# Patient Record
Sex: Female | Born: 1945 | Race: White | Hispanic: No | Smoking: Never smoker
Health system: Southern US, Community
[De-identification: ages and names within clinical notes are randomized; demographics above are authoritative.]

## PROBLEM LIST (undated history)

## (undated) DIAGNOSIS — E079 Disorder of thyroid, unspecified: Secondary | ICD-10-CM

## (undated) HISTORY — PX: BACK SURGERY: SHX140

## (undated) HISTORY — PX: APPENDECTOMY: SHX54

## (undated) HISTORY — PX: KNEE ARTHROSCOPY: SUR90

## (undated) HISTORY — PX: ABDOMINAL SURGERY: SHX537

## (undated) HISTORY — PX: ABDOMINAL HYSTERECTOMY: SHX81

---

## 2016-04-11 ENCOUNTER — Emergency Department (HOSPITAL_COMMUNITY)
Admission: EM | Admit: 2016-04-11 | Discharge: 2016-04-11 | Disposition: A | Discharge status: Home / Self Care | Payer: Medicare HMO | Attending: Emergency Medicine | Admitting: Emergency Medicine

## 2016-04-11 ENCOUNTER — Encounter (HOSPITAL_COMMUNITY): Payer: Self-pay | Admitting: Emergency Medicine

## 2016-04-11 DIAGNOSIS — F1193 Opioid use, unspecified with withdrawal: Secondary | ICD-10-CM

## 2016-04-11 DIAGNOSIS — R69 Illness, unspecified: Secondary | ICD-10-CM | POA: Diagnosis not present

## 2016-04-11 DIAGNOSIS — Z79899 Other long term (current) drug therapy: Secondary | ICD-10-CM | POA: Diagnosis not present

## 2016-04-11 DIAGNOSIS — F1123 Opioid dependence with withdrawal: Secondary | ICD-10-CM | POA: Insufficient documentation

## 2016-04-11 HISTORY — DX: Disorder of thyroid, unspecified: E07.9

## 2016-04-11 LAB — URINALYSIS, ROUTINE W REFLEX MICROSCOPIC
Bilirubin Urine: NEGATIVE
GLUCOSE, UA: NEGATIVE mg/dL
Ketones, ur: NEGATIVE mg/dL
Nitrite: NEGATIVE
PH: 7 (ref 5.0–8.0)
PROTEIN: NEGATIVE mg/dL
Specific Gravity, Urine: 1.015 (ref 1.005–1.030)

## 2016-04-11 LAB — CBC WITH DIFFERENTIAL/PLATELET
BASOS PCT: 0 %
Basophils Absolute: 0 10*3/uL (ref 0.0–0.1)
Eosinophils Absolute: 0.1 10*3/uL (ref 0.0–0.7)
Eosinophils Relative: 2 %
HEMATOCRIT: 40.4 % (ref 36.0–46.0)
HEMOGLOBIN: 13.9 g/dL (ref 12.0–15.0)
LYMPHS ABS: 2.4 10*3/uL (ref 0.7–4.0)
LYMPHS PCT: 30 %
MCH: 29.4 pg (ref 26.0–34.0)
MCHC: 34.4 g/dL (ref 30.0–36.0)
MCV: 85.4 fL (ref 78.0–100.0)
MONO ABS: 0.7 10*3/uL (ref 0.1–1.0)
MONOS PCT: 8 %
NEUTROS PCT: 60 %
Neutro Abs: 4.8 10*3/uL (ref 1.7–7.7)
Platelets: 351 10*3/uL (ref 150–400)
RBC: 4.73 MIL/uL (ref 3.87–5.11)
RDW: 13.6 % (ref 11.5–15.5)
WBC: 8 10*3/uL (ref 4.0–10.5)

## 2016-04-11 LAB — RAPID URINE DRUG SCREEN, HOSP PERFORMED
AMPHETAMINES: NOT DETECTED
BENZODIAZEPINES: NOT DETECTED
Barbiturates: NOT DETECTED
COCAINE: NOT DETECTED
Opiates: NOT DETECTED
Tetrahydrocannabinol: NOT DETECTED

## 2016-04-11 LAB — COMPREHENSIVE METABOLIC PANEL
ALBUMIN: 4.3 g/dL (ref 3.5–5.0)
ALK PHOS: 68 U/L (ref 38–126)
ALT: 12 U/L — ABNORMAL LOW (ref 14–54)
ANION GAP: 9 (ref 5–15)
AST: 18 U/L (ref 15–41)
BUN: 14 mg/dL (ref 6–20)
CHLORIDE: 99 mmol/L — AB (ref 101–111)
CO2: 29 mmol/L (ref 22–32)
Calcium: 9.8 mg/dL (ref 8.9–10.3)
Creatinine, Ser: 0.9 mg/dL (ref 0.44–1.00)
GFR calc non Af Amer: >60 mL/min (ref >60)
GLUCOSE: 110 mg/dL — AB (ref 65–99)
POTASSIUM: 3.9 mmol/L (ref 3.5–5.1)
SODIUM: 137 mmol/L (ref 135–145)
Total Bilirubin: 1.1 mg/dL (ref 0.3–1.2)
Total Protein: 7.7 g/dL (ref 6.5–8.1)

## 2016-04-11 LAB — ETHANOL: Alcohol, Ethyl (B): <5 mg/dL (ref <5)

## 2016-04-11 MED ORDER — CLONIDINE HCL 0.1 MG PO TABS
0.1000 mg | ORAL_TABLET | Freq: Three times a day (TID) | ORAL | Status: DC | PRN
  Administered 2016-04-11: 0.1 mg via ORAL
  Filled 2016-04-11: qty 1

## 2016-04-11 MED ORDER — CLONIDINE HCL 0.1 MG PO TABS: 0.1000 mg | ORAL_TABLET | Freq: Two times a day (BID) | ORAL | 1 refills | Status: AC | PRN

## 2016-04-11 NOTE — Discharge Instructions (Signed)
Please follow up with your pain clinic Take the Clonidine twice daily as needed for withdrawal symptoms DO NOT take the clonidine if your blood pressure is below 120 - I suggest that you get a home blood pressure cuff so that you can measure this before you take the medication.  Start [place] 3 times a day, call your family doctor and let them know the regarding his medication. Please make sure that you go to your pain clinic

## 2016-04-11 NOTE — ED Notes (Signed)
Patient belongings returned to patient at this time. 

## 2016-04-11 NOTE — ED Notes (Signed)
Pt reports that she has been off her fentanyl patch for 2 weeks. 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 Has been on patch for 18 years. States patch makes her nervous and cold sweats by the 3rd day. Now unable to sleep, anxious, cold sweats and has thoughts of harming self

## 2016-04-11 NOTE — ED Notes (Signed)
[facility] called and does not meet inpt criteria. Will fax over resources

## 2016-04-11 NOTE — BH Assessment (Signed)
Tele Assessment Note   Julie Kirk is an 71 y.o. female. Pt reports withdrawal symptoms from a Fentanyl patch. Per Pt she's had a Fentanyl patch for 18 years due to pain in her leg. Pt states she's been wanting the patch removed from her leg due to complications but her physician refused. Pt states she took the patch off herself and she immediately began having withdrawal. Pt reports the following withdrawal symptoms: cold chills, sweating, dirreahea, muscle cramps, anxiety, and muscle spasms. Pt reports SI with no plan due to withdrawal symptoms. Pt denies HI and AVH. Pt states she can contract for safety and contact SA programs for her withdrawal symptoms. Pt denies previous hospitalizations. Pt denies current mental health treatment.   Writer consulted with Jacki Cones, NP. Per Jacki Cones, NP Pt does not meet inpatient criteria. Recommends follow-up with outpatient SA resources.   Diagnosis:   F11.20   Past Medical History:  Past Medical History:  Diagnosis Date  . Thyroid disease     Past Surgical History:  Procedure Laterality Date  . ABDOMINAL HYSTERECTOMY    . ABDOMINAL SURGERY    . APPENDECTOMY    . BACK SURGERY    . KNEE ARTHROSCOPY      Family History: History reviewed. No pertinent family history.  Social History:  reports that she has never smoked. She does not have any smokeless tobacco history on file. She reports that she does not drink alcohol or use drugs.  Additional Social History:  Alcohol / Drug Use Pain Medications: Please see MAR Prescriptions: Please see MAR Over the Counter: Please see MAR History of alcohol / drug use?: No history of alcohol / drug abuse Longest period of sobriety (when/how long): NA  CIWA: CIWA-Ar BP: 116/82 Pulse Rate: 107 COWS: Clinical Opiate Withdrawal Scale (COWS) Resting Pulse Rate: Pulse Rate 80 or below Sweating: Subjective report of chills or flushing Restlessness: Reports difficulty sitting still, but is able to do so Pupil  Size: Pupils pinned or normal size for room light Bone or Joint Aches: Mild diffuse discomfort Runny Nose or Tearing: Nose running or tearing GI Upset: Stomach cramps Tremor: Slight tremor observable Yawning: No yawning Anxiety or Irritability: Patient obviously irritable/anxious Gooseflesh Skin: Skin is smooth COWS Total Score: 10  PATIENT STRENGTHS: (choose at least two) Average or above average intelligence Communication skills  Allergies:  Allergies  Allergen Reactions  . Sulfa Antibiotics Nausea And Vomiting    Home Medications:  (Not in a hospital admission)  OB/GYN Status:  No LMP recorded. Patient has had a hysterectomy.  General Assessment Data Location of Assessment: [facility] TTS Assessment: In system Is this a Tele or Face-to-Face Assessment?: Tele Assessment Is this an Initial Assessment or a Re-assessment for this encounter?: Initial Assessment Marital status: Divorced Cochrane name: NA Is patient pregnant?: No Pregnancy Status: No Living Arrangements: Alone Can pt return to current living arrangement?: Yes Admission Status: Voluntary Is patient capable of signing voluntary admission?: Yes Referral Source: Self/Family/Friend Insurance type: Community education officer     Crisis Care Plan Living Arrangements: Alone Legal Guardian: Other: (self) Name of Psychiatrist: NA Name of Therapist: NA  Education Status Is patient currently in school?: No Current Grade: NA Highest grade of school patient has completed: some college Name of school: NA Contact person: Na  Risk to self with the past 6 months Suicidal Ideation: Yes-Currently Present Has patient been a risk to self within the past 6 months prior to admission? : No Suicidal Intent: No Has patient had any  suicidal intent within the past 6 months prior to admission? : No Is patient at risk for suicide?: No Suicidal Plan?: No Has patient had any suicidal plan within the past 6 months prior to admission? : No Access to  Means: No What has been your use of drugs/alcohol within the last 12 months?: NA Previous Attempts/Gestures: No How many times?: 0 Other Self Harm Risks: NA Triggers for Past Attempts: None known Intentional Self Injurious Behavior: None Family Suicide History: No Recent stressful life event(s): Other (Comment) (withdrawal) Persecutory voices/beliefs?: No Depression: No Depression Symptoms: Loss of interest in usual pleasures, Feeling angry/irritable Substance abuse history and/or treatment for substance abuse?: No Suicide prevention information given to non-admitted patients: Not applicable  Risk to Others within the past 6 months Homicidal Ideation: No Does patient have any lifetime risk of violence toward others beyond the six months prior to admission? : No Thoughts of Harm to Others: No Current Homicidal Intent: No Current Homicidal Plan: No Access to Homicidal Means: No Identified Victim: NA History of harm to others?: No Assessment of Violence: None Noted Violent Behavior Description: NA Does patient have access to weapons?: No Criminal Charges Pending?: No Does patient have a court date: No Is patient on probation?: No  Psychosis Hallucinations: None noted Delusions: None noted  Mental Status Report Appearance/Hygiene: Unremarkable, In scrubs Eye Contact: Fair Motor Activity: Freedom of movement Speech: Logical/coherent Level of Consciousness: Alert Mood: Depressed, Sad Affect: Depressed, Sad Anxiety Level: None Thought Processes: Coherent, Relevant Judgement: Unimpaired Orientation: Person, Place, Time, Situation Obsessive Compulsive Thoughts/Behaviors: None  Cognitive Functioning Concentration: Normal Memory: Recent Intact, Remote Intact IQ: Average Insight: Fair Impulse Control: Fair Appetite: Fair Weight Loss: 0 Weight Gain: 0 Sleep: Decreased Total Hours of Sleep: 5 Vegetative Symptoms: None  ADLScreening [facility] Assessment Services) Patient's  cognitive ability adequate to safely complete daily activities?: Yes Patient able to express need for assistance with ADLs?: Yes Independently performs ADLs?: Yes (appropriate for developmental age)  Prior Inpatient Therapy Prior Inpatient Therapy: No Prior Therapy Dates: NA Prior Therapy Facilty/Provider(s): NA Reason for Treatment: NA  Prior Outpatient Therapy Prior Outpatient Therapy: No Prior Therapy Dates: NA Prior Therapy Facilty/Provider(s): NA Reason for Treatment: NA Does patient have an ACCT team?: No Does patient have Intensive In-House Services?  : No Does patient have [facility] services? : No Does patient have P4CC services?: No  ADL Screening (condition at time of admission) Patient's cognitive ability adequate to safely complete daily activities?: Yes Is the patient deaf or have difficulty hearing?: No Does the patient have difficulty seeing, even when wearing glasses/contacts?: No Does the patient have difficulty concentrating, remembering, or making decisions?: No Patient able to express need for assistance with ADLs?: Yes Does the patient have difficulty dressing or bathing?: No Independently performs ADLs?: Yes (appropriate for developmental age) Does the patient have difficulty walking or climbing stairs?: No Weakness of Legs: None Weakness of Arms/Hands: None       Abuse/Neglect Assessment (Assessment to be complete while patient is alone) Physical Abuse: Denies Verbal Abuse: Denies Sexual Abuse: Denies Exploitation of patient/patient's resources: Denies Self-Neglect: Denies     Merchant navy officer (For Healthcare) Does Patient Have a Medical Advance Directive?: No Would patient like information on creating a medical advance directive?: No - Patient declined    Additional Information 1:1 In Past 12 Months?: No CIRT Risk: No Elopement Risk: No Does patient have medical clearance?: Yes     Disposition:  Disposition Initial Assessment Completed  for this Encounter: Yes  Lynnetta Tom D 04/11/2016 9:34 AM

## 2016-04-11 NOTE — ED Provider Notes (Signed)
[facility] Provider Note   CSN: [id number] Arrival date & time: 04/11/16  0701     History   Chief Complaint Chief Complaint  Patient presents with  . Withdrawal  . V70.1    HPI Julie Kirk is a 71 y.o. female.  HPI  The patient is a 71 year old female, she has a known history of hypothyroidism but also has a history of chronic pain which she describes that was initially diagnosed as a peripheral neuropathy and was treated initially with fentanyl patch, 50 g which the patient has used for the last 18 years. She has had chronic constipation because of some difficulties with insomnia she decided that she wanted to get off of the fentanyl patch. She has seen a pain clinic, she was upset because they recommended that she have an increase in some additional medications and staying on the patch because it was hard to come off. She was not happy with this answer, she decided to take herself off of this medication by herself. She tapered off of it by using half a patch and then going to a quarter of a patch over the last couple of weeks. She has now been without the medication for approximately 3 weeks and has had a progressive decline feeling that she has had frequent loose and watery stools, feeling jittery, feeling that she has chronic pain in her legs, feeling like she is hot and overheated constantly. She reports that she feels as though she just came out of a microwave oven. She feels that she is not getting much sleep and is overall decompensating. She does not know if she wants to go back to the pain clinic because she does not feel that there was listening to her. She wants to get off of medications and not gone to more medications which she states is what they suggested. She denies coughing chest pain or shortness of breath.  Past Medical History:  Diagnosis Date  . Thyroid disease     There are no active problems to display for this patient.   Past Surgical History:    Procedure Laterality Date  . ABDOMINAL HYSTERECTOMY    . ABDOMINAL SURGERY    . APPENDECTOMY    . BACK SURGERY    . KNEE ARTHROSCOPY      OB History    No data available       Home Medications    Prior to Admission medications   Medication Sig Start Date End Date Taking? Authorizing Provider  fentaNYL (DURAGESIC - DOSED MCG/HR) 50 MCG/HR Place 50 mcg onto the skin every 3 (three) days.   Yes Historical Provider, MD  levothyroxine (SYNTHROID, LEVOTHROID) 75 MCG tablet Take 75 mcg by mouth daily before breakfast.   Yes Historical Provider, MD  lisinopril-hydrochlorothiazide (PRINZIDE,ZESTORETIC) 10-12.5 MG tablet Take 1 tablet by mouth daily.   Yes Historical Provider, MD  cloNIDine (CATAPRES) 0.1 MG tablet Take 1 tablet (0.1 mg total) by mouth every 12 (twelve) hours as needed (withdrawal). 04/11/16   Eber HongBrian Amora Sheehy, MD    Family History History reviewed. No pertinent family history.  Social History Social History  Substance Use Topics  . Smoking status: Never Smoker  . Smokeless tobacco: Not on file  . Alcohol use No     Allergies   Sulfa antibiotics   Review of Systems Review of Systems  All other systems reviewed and are negative.    Physical Exam Updated Vital Signs BP (!) 95/54 (BP Location: Left Arm)  Pulse 62   Temp 98.9 F (37.2 C) (Oral)   Resp 16   Ht 5\' 5"  (1.651 m)   Wt 154 lb (69.9 kg)   SpO2 96%   BMI 25.63 kg/m   Physical Exam  Constitutional: She appears well-developed and well-nourished. No distress.  HENT:  Head: Normocephalic and atraumatic.  Mouth/Throat: Oropharynx is clear and moist. No oropharyngeal exudate.  Eyes: Conjunctivae and EOM are normal. Pupils are equal, round, and reactive to light. Right eye exhibits no discharge. Left eye exhibits no discharge. No scleral icterus.  Neck: Normal range of motion. Neck supple. No JVD present. No thyromegaly present.  Cardiovascular: Normal rate, regular rhythm, normal heart sounds and  intact distal pulses.  Exam reveals no gallop and no friction rub.   No murmur heard. Pulmonary/Chest: Effort normal and breath sounds normal. No respiratory distress. She has no wheezes. She has no rales.  Abdominal: Soft. Bowel sounds are normal. She exhibits no distension and no mass. There is no tenderness.  Musculoskeletal: Normal range of motion. She exhibits no edema or tenderness.  Lymphadenopathy:    She has no cervical adenopathy.  Neurological: She is alert. Coordination normal.  Skin: Skin is warm and dry. No rash noted. No erythema.  Psychiatric:  The patient is anxious, she is upset, she is not hallucinating, she denies active suicidal plans though she states "I can't live like this"  Nursing note and vitals reviewed.    ED Treatments / Results  Labs (all labs ordered are listed, but only abnormal results are displayed) Labs Reviewed  COMPREHENSIVE METABOLIC PANEL - Abnormal; Notable for the following:       Result Value   Chloride 99 (*)    Glucose, Bld 110 (*)    ALT 12 (*)    All other components within normal limits  URINALYSIS, ROUTINE W REFLEX MICROSCOPIC - Abnormal; Notable for the following:    APPearance HAZY (*)    Hgb urine dipstick MODERATE (*)    Leukocytes, UA TRACE (*)    Bacteria, UA RARE (*)    All other components within normal limits  CBC WITH DIFFERENTIAL/PLATELET  RAPID URINE DRUG SCREEN, HOSP PERFORMED  ETHANOL    EKG  EKG Interpretation None       Radiology No results found.  Procedures Procedures (including critical care time)  Medications Ordered in ED Medications - No data to display   Initial Impression / Assessment and Plan / ED Course  I have reviewed the triage vital signs and the nursing notes.  Pertinent labs & imaging results that were available during my care of the patient were reviewed by me and considered in my medical decision making (see chart for details).    Though the patient appears uncomfortable  related to her withdrawal syndrome she does not have any focal medical abnormalities that would require an emergent intervention. I will check electrolytes due to the frequent diarrhea, psychiatric labs, I will also have her see a psychiatrist and may have some other benefits to other medications that they may want to try. 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 I will give her clonidine, consider other neuropathic medications as well.  Improved with clonidine D/w TTS, they agree with outpt f/u Pt has appt with pain control No signs of life threatening withdrawal Pt not suicidal, just upset over physical sx relating to meds and chronic neuropathic pain  Final Clinical Impressions(s) / ED Diagnoses   Final diagnoses:  Opiate withdrawal (HCC)    New Prescriptions Discharge Medication List as of 04/11/2016 10:35 AM    START taking these medications   Details  cloNIDine (CATAPRES) 0.1 MG tablet Take 1 tablet (0.1 mg total) by mouth every 12 (twelve) hours as needed (withdrawal)., Starting Thu 04/11/2016, Print         Eber Hong, MD 04/12/16 [phone]

## 2016-04-11 NOTE — ED Notes (Signed)
Sitter d/c at this time.

## 2016-04-11 NOTE — ED Triage Notes (Signed)
Pt reports being on fentanyl patch for over 10 years and has asked doctor to help take her off of medication.  Pt has taken self off of patch for 3 weeks and has had symptoms including :insomnia, anxiety, burning feeling all over body, nausea, diarrhea, sweating, cold chills.  Pt alert and oriented at this time.  When asking if pt has any suicidal, pt states I am having thoughts of suicide, but I dont think I would go through with it.

## 2016-04-11 NOTE — ED Notes (Signed)
PT denied any SI at time of discharge and encouraged to return if she develops Si at anytime.

## 2016-04-11 NOTE — ED Notes (Signed)
Pt dressed in paper scrubs, wanded by security.

## 2016-04-16 DIAGNOSIS — M47817 Spondylosis without myelopathy or radiculopathy, lumbosacral region: Secondary | ICD-10-CM | POA: Diagnosis not present

## 2016-04-16 DIAGNOSIS — M5416 Radiculopathy, lumbar region: Secondary | ICD-10-CM | POA: Diagnosis not present

## 2016-04-16 DIAGNOSIS — G894 Chronic pain syndrome: Secondary | ICD-10-CM | POA: Diagnosis not present

## 2016-04-16 DIAGNOSIS — Z79891 Long term (current) use of opiate analgesic: Secondary | ICD-10-CM | POA: Diagnosis not present

## 2016-04-16 DIAGNOSIS — G2581 Restless legs syndrome: Secondary | ICD-10-CM | POA: Diagnosis not present

## 2016-09-12 DIAGNOSIS — Z6825 Body mass index (BMI) 25.0-25.9, adult: Secondary | ICD-10-CM | POA: Diagnosis not present

## 2016-09-12 DIAGNOSIS — Z789 Other specified health status: Secondary | ICD-10-CM | POA: Diagnosis not present

## 2016-09-12 DIAGNOSIS — I10 Essential (primary) hypertension: Secondary | ICD-10-CM | POA: Diagnosis not present

## 2016-09-12 DIAGNOSIS — M549 Dorsalgia, unspecified: Secondary | ICD-10-CM | POA: Diagnosis not present

## 2016-09-12 DIAGNOSIS — Z299 Encounter for prophylactic measures, unspecified: Secondary | ICD-10-CM | POA: Diagnosis not present

## 2016-09-12 DIAGNOSIS — E7800 Pure hypercholesterolemia, unspecified: Secondary | ICD-10-CM | POA: Diagnosis not present

## 2016-09-12 DIAGNOSIS — E039 Hypothyroidism, unspecified: Secondary | ICD-10-CM | POA: Diagnosis not present

## 2016-09-12 DIAGNOSIS — J069 Acute upper respiratory infection, unspecified: Secondary | ICD-10-CM | POA: Diagnosis not present

## 2016-10-07 DIAGNOSIS — Z7189 Other specified counseling: Secondary | ICD-10-CM | POA: Diagnosis not present

## 2016-10-07 DIAGNOSIS — G8929 Other chronic pain: Secondary | ICD-10-CM | POA: Diagnosis not present

## 2016-10-07 DIAGNOSIS — R350 Frequency of micturition: Secondary | ICD-10-CM | POA: Diagnosis not present

## 2016-10-07 DIAGNOSIS — M8580 Other specified disorders of bone density and structure, unspecified site: Secondary | ICD-10-CM | POA: Diagnosis not present

## 2016-10-07 DIAGNOSIS — Z0000 Encounter for general adult medical examination without abnormal findings: Secondary | ICD-10-CM | POA: Diagnosis not present

## 2016-10-07 DIAGNOSIS — Z6825 Body mass index (BMI) 25.0-25.9, adult: Secondary | ICD-10-CM | POA: Diagnosis not present

## 2016-10-07 DIAGNOSIS — Z1211 Encounter for screening for malignant neoplasm of colon: Secondary | ICD-10-CM | POA: Diagnosis not present

## 2016-10-07 DIAGNOSIS — E039 Hypothyroidism, unspecified: Secondary | ICD-10-CM | POA: Diagnosis not present

## 2016-10-07 DIAGNOSIS — E7800 Pure hypercholesterolemia, unspecified: Secondary | ICD-10-CM | POA: Diagnosis not present

## 2016-10-07 DIAGNOSIS — Z299 Encounter for prophylactic measures, unspecified: Secondary | ICD-10-CM | POA: Diagnosis not present

## 2016-10-07 DIAGNOSIS — Z1389 Encounter for screening for other disorder: Secondary | ICD-10-CM | POA: Diagnosis not present

## 2016-10-07 DIAGNOSIS — I10 Essential (primary) hypertension: Secondary | ICD-10-CM | POA: Diagnosis not present

## 2016-10-07 DIAGNOSIS — Z79899 Other long term (current) drug therapy: Secondary | ICD-10-CM | POA: Diagnosis not present

## 2016-11-11 DIAGNOSIS — M238X2 Other internal derangements of left knee: Secondary | ICD-10-CM | POA: Diagnosis not present

## 2016-11-11 DIAGNOSIS — M7052 Other bursitis of knee, left knee: Secondary | ICD-10-CM | POA: Diagnosis not present

## 2016-11-19 DIAGNOSIS — M25562 Pain in left knee: Secondary | ICD-10-CM | POA: Diagnosis not present

## 2016-11-19 DIAGNOSIS — M238X2 Other internal derangements of left knee: Secondary | ICD-10-CM | POA: Diagnosis not present

## 2016-11-19 DIAGNOSIS — M7052 Other bursitis of knee, left knee: Secondary | ICD-10-CM | POA: Diagnosis not present

## 2016-11-27 DIAGNOSIS — M25562 Pain in left knee: Secondary | ICD-10-CM | POA: Diagnosis not present

## 2016-12-03 DIAGNOSIS — S83222A Peripheral tear of medial meniscus, current injury, left knee, initial encounter: Secondary | ICD-10-CM | POA: Diagnosis not present

## 2016-12-03 DIAGNOSIS — M2392 Unspecified internal derangement of left knee: Secondary | ICD-10-CM | POA: Diagnosis not present

## 2016-12-03 DIAGNOSIS — M7052 Other bursitis of knee, left knee: Secondary | ICD-10-CM | POA: Diagnosis not present

## 2016-12-03 DIAGNOSIS — M25562 Pain in left knee: Secondary | ICD-10-CM | POA: Diagnosis not present

## 2017-01-24 DIAGNOSIS — S83282A Other tear of lateral meniscus, current injury, left knee, initial encounter: Secondary | ICD-10-CM | POA: Diagnosis not present

## 2017-01-24 DIAGNOSIS — S83222D Peripheral tear of medial meniscus, current injury, left knee, subsequent encounter: Secondary | ICD-10-CM | POA: Diagnosis not present

## 2017-01-24 DIAGNOSIS — M170 Bilateral primary osteoarthritis of knee: Secondary | ICD-10-CM | POA: Diagnosis not present

## 2017-02-27 DIAGNOSIS — S83282A Other tear of lateral meniscus, current injury, left knee, initial encounter: Secondary | ICD-10-CM | POA: Diagnosis not present

## 2017-02-27 DIAGNOSIS — M94262 Chondromalacia, left knee: Secondary | ICD-10-CM | POA: Diagnosis not present

## 2017-02-27 DIAGNOSIS — S83242A Other tear of medial meniscus, current injury, left knee, initial encounter: Secondary | ICD-10-CM | POA: Diagnosis not present

## 2017-02-27 DIAGNOSIS — Y999 Unspecified external cause status: Secondary | ICD-10-CM | POA: Diagnosis not present

## 2017-02-27 DIAGNOSIS — S83232A Complex tear of medial meniscus, current injury, left knee, initial encounter: Secondary | ICD-10-CM | POA: Diagnosis not present

## 2017-02-27 DIAGNOSIS — G8918 Other acute postprocedural pain: Secondary | ICD-10-CM | POA: Diagnosis not present

## 2017-03-10 DIAGNOSIS — Z299 Encounter for prophylactic measures, unspecified: Secondary | ICD-10-CM | POA: Diagnosis not present

## 2017-03-10 DIAGNOSIS — J069 Acute upper respiratory infection, unspecified: Secondary | ICD-10-CM | POA: Diagnosis not present

## 2017-03-10 DIAGNOSIS — I10 Essential (primary) hypertension: Secondary | ICD-10-CM | POA: Diagnosis not present

## 2017-03-10 DIAGNOSIS — Z789 Other specified health status: Secondary | ICD-10-CM | POA: Diagnosis not present

## 2017-03-10 DIAGNOSIS — Z6828 Body mass index (BMI) 28.0-28.9, adult: Secondary | ICD-10-CM | POA: Diagnosis not present

## 2017-08-01 DIAGNOSIS — M170 Bilateral primary osteoarthritis of knee: Secondary | ICD-10-CM | POA: Diagnosis not present

## 2017-08-01 DIAGNOSIS — M1712 Unilateral primary osteoarthritis, left knee: Secondary | ICD-10-CM | POA: Diagnosis not present

## 2017-08-01 DIAGNOSIS — M25561 Pain in right knee: Secondary | ICD-10-CM | POA: Diagnosis not present

## 2017-08-24 DIAGNOSIS — W0110XA Fall on same level from slipping, tripping and stumbling with subsequent striking against unspecified object, initial encounter: Secondary | ICD-10-CM | POA: Diagnosis not present

## 2017-08-24 DIAGNOSIS — S0083XA Contusion of other part of head, initial encounter: Secondary | ICD-10-CM | POA: Diagnosis not present

## 2017-08-24 DIAGNOSIS — G629 Polyneuropathy, unspecified: Secondary | ICD-10-CM | POA: Diagnosis not present

## 2017-08-24 DIAGNOSIS — S8002XA Contusion of left knee, initial encounter: Secondary | ICD-10-CM | POA: Diagnosis not present

## 2017-08-24 DIAGNOSIS — W1839XA Other fall on same level, initial encounter: Secondary | ICD-10-CM | POA: Diagnosis not present

## 2017-08-24 DIAGNOSIS — I10 Essential (primary) hypertension: Secondary | ICD-10-CM | POA: Diagnosis not present

## 2017-08-24 DIAGNOSIS — S0121XA Laceration without foreign body of nose, initial encounter: Secondary | ICD-10-CM | POA: Diagnosis not present

## 2017-08-24 DIAGNOSIS — Z79899 Other long term (current) drug therapy: Secondary | ICD-10-CM | POA: Diagnosis not present

## 2017-08-24 DIAGNOSIS — M542 Cervicalgia: Secondary | ICD-10-CM | POA: Diagnosis not present

## 2017-08-24 DIAGNOSIS — R55 Syncope and collapse: Secondary | ICD-10-CM | POA: Diagnosis not present

## 2017-08-24 DIAGNOSIS — S0993XA Unspecified injury of face, initial encounter: Secondary | ICD-10-CM | POA: Diagnosis not present

## 2017-08-24 DIAGNOSIS — S199XXA Unspecified injury of neck, initial encounter: Secondary | ICD-10-CM | POA: Diagnosis not present

## 2017-08-24 DIAGNOSIS — S0990XA Unspecified injury of head, initial encounter: Secondary | ICD-10-CM | POA: Diagnosis not present

## 2017-08-24 DIAGNOSIS — S8001XA Contusion of right knee, initial encounter: Secondary | ICD-10-CM | POA: Diagnosis not present

## 2017-09-02 DIAGNOSIS — M1711 Unilateral primary osteoarthritis, right knee: Secondary | ICD-10-CM | POA: Diagnosis not present

## 2017-09-02 DIAGNOSIS — M1712 Unilateral primary osteoarthritis, left knee: Secondary | ICD-10-CM | POA: Diagnosis not present

## 2017-09-11 DIAGNOSIS — M1711 Unilateral primary osteoarthritis, right knee: Secondary | ICD-10-CM | POA: Diagnosis not present

## 2017-09-11 DIAGNOSIS — M1712 Unilateral primary osteoarthritis, left knee: Secondary | ICD-10-CM | POA: Diagnosis not present

## 2017-09-16 DIAGNOSIS — M1712 Unilateral primary osteoarthritis, left knee: Secondary | ICD-10-CM | POA: Diagnosis not present

## 2017-09-16 DIAGNOSIS — M1711 Unilateral primary osteoarthritis, right knee: Secondary | ICD-10-CM | POA: Diagnosis not present

## 2017-10-01 DIAGNOSIS — Z299 Encounter for prophylactic measures, unspecified: Secondary | ICD-10-CM | POA: Diagnosis not present

## 2017-10-01 DIAGNOSIS — E039 Hypothyroidism, unspecified: Secondary | ICD-10-CM | POA: Diagnosis not present

## 2017-10-01 DIAGNOSIS — Z6829 Body mass index (BMI) 29.0-29.9, adult: Secondary | ICD-10-CM | POA: Diagnosis not present

## 2017-10-01 DIAGNOSIS — G8929 Other chronic pain: Secondary | ICD-10-CM | POA: Diagnosis not present

## 2017-10-01 DIAGNOSIS — I10 Essential (primary) hypertension: Secondary | ICD-10-CM | POA: Diagnosis not present

## 2017-10-01 DIAGNOSIS — G629 Polyneuropathy, unspecified: Secondary | ICD-10-CM | POA: Diagnosis not present

## 2017-10-15 DIAGNOSIS — Z79899 Other long term (current) drug therapy: Secondary | ICD-10-CM | POA: Diagnosis not present

## 2017-10-15 DIAGNOSIS — Z6829 Body mass index (BMI) 29.0-29.9, adult: Secondary | ICD-10-CM | POA: Diagnosis not present

## 2017-10-15 DIAGNOSIS — Z7189 Other specified counseling: Secondary | ICD-10-CM | POA: Diagnosis not present

## 2017-10-15 DIAGNOSIS — I10 Essential (primary) hypertension: Secondary | ICD-10-CM | POA: Diagnosis not present

## 2017-10-15 DIAGNOSIS — Z0000 Encounter for general adult medical examination without abnormal findings: Secondary | ICD-10-CM | POA: Diagnosis not present

## 2017-10-15 DIAGNOSIS — Z1211 Encounter for screening for malignant neoplasm of colon: Secondary | ICD-10-CM | POA: Diagnosis not present

## 2017-10-15 DIAGNOSIS — Z1331 Encounter for screening for depression: Secondary | ICD-10-CM | POA: Diagnosis not present

## 2017-10-15 DIAGNOSIS — E039 Hypothyroidism, unspecified: Secondary | ICD-10-CM | POA: Diagnosis not present

## 2017-10-15 DIAGNOSIS — Z299 Encounter for prophylactic measures, unspecified: Secondary | ICD-10-CM | POA: Diagnosis not present

## 2017-10-15 DIAGNOSIS — E559 Vitamin D deficiency, unspecified: Secondary | ICD-10-CM | POA: Diagnosis not present

## 2017-10-15 DIAGNOSIS — Z1339 Encounter for screening examination for other mental health and behavioral disorders: Secondary | ICD-10-CM | POA: Diagnosis not present

## 2017-10-15 DIAGNOSIS — E7800 Pure hypercholesterolemia, unspecified: Secondary | ICD-10-CM | POA: Diagnosis not present

## 2017-10-27 DIAGNOSIS — M238X1 Other internal derangements of right knee: Secondary | ICD-10-CM | POA: Diagnosis not present

## 2017-10-27 DIAGNOSIS — M1711 Unilateral primary osteoarthritis, right knee: Secondary | ICD-10-CM | POA: Diagnosis not present

## 2017-10-27 DIAGNOSIS — M1712 Unilateral primary osteoarthritis, left knee: Secondary | ICD-10-CM | POA: Diagnosis not present

## 2017-11-05 DIAGNOSIS — M25561 Pain in right knee: Secondary | ICD-10-CM | POA: Diagnosis not present

## 2017-11-17 DIAGNOSIS — M2341 Loose body in knee, right knee: Secondary | ICD-10-CM | POA: Diagnosis not present

## 2017-11-17 DIAGNOSIS — M2241 Chondromalacia patellae, right knee: Secondary | ICD-10-CM | POA: Diagnosis not present

## 2017-11-17 DIAGNOSIS — S83241A Other tear of medial meniscus, current injury, right knee, initial encounter: Secondary | ICD-10-CM | POA: Diagnosis not present

## 2017-12-02 DIAGNOSIS — M94261 Chondromalacia, right knee: Secondary | ICD-10-CM | POA: Diagnosis not present

## 2017-12-02 DIAGNOSIS — Y999 Unspecified external cause status: Secondary | ICD-10-CM | POA: Diagnosis not present

## 2017-12-02 DIAGNOSIS — G8918 Other acute postprocedural pain: Secondary | ICD-10-CM | POA: Diagnosis not present

## 2017-12-02 DIAGNOSIS — S83231A Complex tear of medial meniscus, current injury, right knee, initial encounter: Secondary | ICD-10-CM | POA: Diagnosis not present

## 2017-12-10 DIAGNOSIS — Z4789 Encounter for other orthopedic aftercare: Secondary | ICD-10-CM | POA: Diagnosis not present

## 2017-12-10 DIAGNOSIS — M1711 Unilateral primary osteoarthritis, right knee: Secondary | ICD-10-CM | POA: Diagnosis not present

## 2017-12-10 DIAGNOSIS — M25561 Pain in right knee: Secondary | ICD-10-CM | POA: Diagnosis not present

## 2017-12-24 DIAGNOSIS — Z4789 Encounter for other orthopedic aftercare: Secondary | ICD-10-CM | POA: Diagnosis not present

## 2017-12-24 DIAGNOSIS — M25561 Pain in right knee: Secondary | ICD-10-CM | POA: Diagnosis not present

## 2018-01-08 DIAGNOSIS — E039 Hypothyroidism, unspecified: Secondary | ICD-10-CM | POA: Diagnosis not present

## 2018-02-24 DIAGNOSIS — M25561 Pain in right knee: Secondary | ICD-10-CM | POA: Diagnosis not present

## 2018-03-09 DIAGNOSIS — M7051 Other bursitis of knee, right knee: Secondary | ICD-10-CM | POA: Diagnosis not present

## 2018-03-09 DIAGNOSIS — Z4789 Encounter for other orthopedic aftercare: Secondary | ICD-10-CM | POA: Diagnosis not present

## 2018-04-03 DIAGNOSIS — E2839 Other primary ovarian failure: Secondary | ICD-10-CM | POA: Diagnosis not present

## 2018-04-06 DIAGNOSIS — G5781 Other specified mononeuropathies of right lower limb: Secondary | ICD-10-CM | POA: Diagnosis not present

## 2018-04-06 DIAGNOSIS — Z9889 Other specified postprocedural states: Secondary | ICD-10-CM | POA: Diagnosis not present

## 2018-04-06 DIAGNOSIS — M1711 Unilateral primary osteoarthritis, right knee: Secondary | ICD-10-CM | POA: Diagnosis not present

## 2018-04-27 DIAGNOSIS — G5781 Other specified mononeuropathies of right lower limb: Secondary | ICD-10-CM | POA: Diagnosis not present

## 2018-04-27 DIAGNOSIS — Z9889 Other specified postprocedural states: Secondary | ICD-10-CM | POA: Diagnosis not present

## 2018-04-27 DIAGNOSIS — M1711 Unilateral primary osteoarthritis, right knee: Secondary | ICD-10-CM | POA: Diagnosis not present

## 2018-04-30 DIAGNOSIS — Z6829 Body mass index (BMI) 29.0-29.9, adult: Secondary | ICD-10-CM | POA: Diagnosis not present

## 2018-04-30 DIAGNOSIS — E039 Hypothyroidism, unspecified: Secondary | ICD-10-CM | POA: Diagnosis not present

## 2018-04-30 DIAGNOSIS — E7800 Pure hypercholesterolemia, unspecified: Secondary | ICD-10-CM | POA: Diagnosis not present

## 2018-04-30 DIAGNOSIS — Z299 Encounter for prophylactic measures, unspecified: Secondary | ICD-10-CM | POA: Diagnosis not present

## 2018-04-30 DIAGNOSIS — Z2821 Immunization not carried out because of patient refusal: Secondary | ICD-10-CM | POA: Diagnosis not present

## 2018-04-30 DIAGNOSIS — I10 Essential (primary) hypertension: Secondary | ICD-10-CM | POA: Diagnosis not present

## 2018-04-30 DIAGNOSIS — G2581 Restless legs syndrome: Secondary | ICD-10-CM | POA: Diagnosis not present

## 2018-05-05 DIAGNOSIS — M79674 Pain in right toe(s): Secondary | ICD-10-CM | POA: Diagnosis not present

## 2018-05-05 DIAGNOSIS — M79671 Pain in right foot: Secondary | ICD-10-CM | POA: Diagnosis not present

## 2018-05-05 DIAGNOSIS — M79672 Pain in left foot: Secondary | ICD-10-CM | POA: Diagnosis not present

## 2018-05-05 DIAGNOSIS — L600 Ingrowing nail: Secondary | ICD-10-CM | POA: Diagnosis not present

## 2018-05-05 DIAGNOSIS — M25774 Osteophyte, right foot: Secondary | ICD-10-CM | POA: Diagnosis not present

## 2018-05-05 DIAGNOSIS — M25775 Osteophyte, left foot: Secondary | ICD-10-CM | POA: Diagnosis not present

## 2018-05-06 DIAGNOSIS — M25561 Pain in right knee: Secondary | ICD-10-CM | POA: Diagnosis not present

## 2018-05-21 DIAGNOSIS — M79674 Pain in right toe(s): Secondary | ICD-10-CM | POA: Diagnosis not present

## 2018-05-21 DIAGNOSIS — L600 Ingrowing nail: Secondary | ICD-10-CM | POA: Diagnosis not present

## 2018-05-21 DIAGNOSIS — M79671 Pain in right foot: Secondary | ICD-10-CM | POA: Diagnosis not present

## 2018-05-25 DIAGNOSIS — M1711 Unilateral primary osteoarthritis, right knee: Secondary | ICD-10-CM | POA: Diagnosis not present

## 2018-05-28 DIAGNOSIS — R300 Dysuria: Secondary | ICD-10-CM | POA: Diagnosis not present

## 2018-05-28 DIAGNOSIS — I10 Essential (primary) hypertension: Secondary | ICD-10-CM | POA: Diagnosis not present

## 2018-05-28 DIAGNOSIS — Z6829 Body mass index (BMI) 29.0-29.9, adult: Secondary | ICD-10-CM | POA: Diagnosis not present

## 2018-05-28 DIAGNOSIS — Z299 Encounter for prophylactic measures, unspecified: Secondary | ICD-10-CM | POA: Diagnosis not present

## 2018-05-28 DIAGNOSIS — Z789 Other specified health status: Secondary | ICD-10-CM | POA: Diagnosis not present

## 2018-10-19 DIAGNOSIS — Z1211 Encounter for screening for malignant neoplasm of colon: Secondary | ICD-10-CM | POA: Diagnosis not present

## 2018-10-19 DIAGNOSIS — Z299 Encounter for prophylactic measures, unspecified: Secondary | ICD-10-CM | POA: Diagnosis not present

## 2018-10-19 DIAGNOSIS — E559 Vitamin D deficiency, unspecified: Secondary | ICD-10-CM | POA: Diagnosis not present

## 2018-10-19 DIAGNOSIS — Z0000 Encounter for general adult medical examination without abnormal findings: Secondary | ICD-10-CM | POA: Diagnosis not present

## 2018-10-19 DIAGNOSIS — E039 Hypothyroidism, unspecified: Secondary | ICD-10-CM | POA: Diagnosis not present

## 2018-10-19 DIAGNOSIS — Z7189 Other specified counseling: Secondary | ICD-10-CM | POA: Diagnosis not present

## 2018-10-19 DIAGNOSIS — Z1339 Encounter for screening examination for other mental health and behavioral disorders: Secondary | ICD-10-CM | POA: Diagnosis not present

## 2018-10-19 DIAGNOSIS — Z1331 Encounter for screening for depression: Secondary | ICD-10-CM | POA: Diagnosis not present

## 2018-10-19 DIAGNOSIS — Z79899 Other long term (current) drug therapy: Secondary | ICD-10-CM | POA: Diagnosis not present

## 2018-10-19 DIAGNOSIS — E7800 Pure hypercholesterolemia, unspecified: Secondary | ICD-10-CM | POA: Diagnosis not present

## 2018-10-19 DIAGNOSIS — Z6828 Body mass index (BMI) 28.0-28.9, adult: Secondary | ICD-10-CM | POA: Diagnosis not present

## 2018-10-19 DIAGNOSIS — I10 Essential (primary) hypertension: Secondary | ICD-10-CM | POA: Diagnosis not present

## 2018-10-21 DIAGNOSIS — R69 Illness, unspecified: Secondary | ICD-10-CM | POA: Diagnosis not present

## 2018-11-24 DIAGNOSIS — Z6829 Body mass index (BMI) 29.0-29.9, adult: Secondary | ICD-10-CM | POA: Diagnosis not present

## 2018-11-24 DIAGNOSIS — I10 Essential (primary) hypertension: Secondary | ICD-10-CM | POA: Diagnosis not present

## 2018-11-24 DIAGNOSIS — L255 Unspecified contact dermatitis due to plants, except food: Secondary | ICD-10-CM | POA: Diagnosis not present

## 2018-11-24 DIAGNOSIS — Z299 Encounter for prophylactic measures, unspecified: Secondary | ICD-10-CM | POA: Diagnosis not present

## 2018-11-24 DIAGNOSIS — Z713 Dietary counseling and surveillance: Secondary | ICD-10-CM | POA: Diagnosis not present

## 2018-12-21 DIAGNOSIS — L255 Unspecified contact dermatitis due to plants, except food: Secondary | ICD-10-CM | POA: Diagnosis not present

## 2018-12-21 DIAGNOSIS — I10 Essential (primary) hypertension: Secondary | ICD-10-CM | POA: Diagnosis not present

## 2018-12-21 DIAGNOSIS — G629 Polyneuropathy, unspecified: Secondary | ICD-10-CM | POA: Diagnosis not present

## 2018-12-21 DIAGNOSIS — E039 Hypothyroidism, unspecified: Secondary | ICD-10-CM | POA: Diagnosis not present

## 2018-12-21 DIAGNOSIS — Z6828 Body mass index (BMI) 28.0-28.9, adult: Secondary | ICD-10-CM | POA: Diagnosis not present

## 2018-12-21 DIAGNOSIS — Z299 Encounter for prophylactic measures, unspecified: Secondary | ICD-10-CM | POA: Diagnosis not present

## 2018-12-28 DIAGNOSIS — L259 Unspecified contact dermatitis, unspecified cause: Secondary | ICD-10-CM | POA: Diagnosis not present

## 2018-12-28 DIAGNOSIS — L209 Atopic dermatitis, unspecified: Secondary | ICD-10-CM | POA: Diagnosis not present

## 2019-02-23 DIAGNOSIS — L255 Unspecified contact dermatitis due to plants, except food: Secondary | ICD-10-CM | POA: Diagnosis not present

## 2019-02-23 DIAGNOSIS — I10 Essential (primary) hypertension: Secondary | ICD-10-CM | POA: Diagnosis not present

## 2019-02-23 DIAGNOSIS — Z299 Encounter for prophylactic measures, unspecified: Secondary | ICD-10-CM | POA: Diagnosis not present

## 2019-02-23 DIAGNOSIS — Z713 Dietary counseling and surveillance: Secondary | ICD-10-CM | POA: Diagnosis not present

## 2019-02-23 DIAGNOSIS — Z6829 Body mass index (BMI) 29.0-29.9, adult: Secondary | ICD-10-CM | POA: Diagnosis not present

## 2019-05-05 DIAGNOSIS — Z23 Encounter for immunization: Secondary | ICD-10-CM | POA: Diagnosis not present

## 2019-05-18 DIAGNOSIS — I10 Essential (primary) hypertension: Secondary | ICD-10-CM | POA: Diagnosis not present

## 2019-05-18 DIAGNOSIS — Z789 Other specified health status: Secondary | ICD-10-CM | POA: Diagnosis not present

## 2019-05-18 DIAGNOSIS — Z299 Encounter for prophylactic measures, unspecified: Secondary | ICD-10-CM | POA: Diagnosis not present

## 2019-05-18 DIAGNOSIS — R5383 Other fatigue: Secondary | ICD-10-CM | POA: Diagnosis not present

## 2019-05-18 DIAGNOSIS — E039 Hypothyroidism, unspecified: Secondary | ICD-10-CM | POA: Diagnosis not present

## 2019-06-02 DIAGNOSIS — Z23 Encounter for immunization: Secondary | ICD-10-CM | POA: Diagnosis not present

## 2020-01-03 ENCOUNTER — Other Ambulatory Visit: Payer: Self-pay

## 2020-01-03 ENCOUNTER — Emergency Department (HOSPITAL_COMMUNITY): Payer: Medicare HMO

## 2020-01-03 ENCOUNTER — Encounter (HOSPITAL_COMMUNITY): Payer: Self-pay

## 2020-01-03 ENCOUNTER — Emergency Department (HOSPITAL_COMMUNITY)
Admission: EM | Admit: 2020-01-03 | Discharge: 2020-01-03 | Disposition: A | Discharge status: Home / Self Care | Payer: Medicare HMO | Attending: Emergency Medicine | Admitting: Emergency Medicine

## 2020-01-03 DIAGNOSIS — R0981 Nasal congestion: Secondary | ICD-10-CM | POA: Diagnosis not present

## 2020-01-03 DIAGNOSIS — R059 Cough, unspecified: Secondary | ICD-10-CM | POA: Insufficient documentation

## 2020-01-03 DIAGNOSIS — Z8616 Personal history of COVID-19: Secondary | ICD-10-CM | POA: Diagnosis not present

## 2020-01-03 DIAGNOSIS — R0602 Shortness of breath: Secondary | ICD-10-CM | POA: Diagnosis not present

## 2020-01-03 LAB — CBC
HCT: 39.9 % (ref 36.0–46.0)
Hemoglobin: 13.4 g/dL (ref 12.0–15.0)
MCH: 29.3 pg (ref 26.0–34.0)
MCHC: 33.6 g/dL (ref 30.0–36.0)
MCV: 87.3 fL (ref 80.0–100.0)
Platelets: 357 10*3/uL (ref 150–400)
RBC: 4.57 MIL/uL (ref 3.87–5.11)
RDW: 13.8 % (ref 11.5–15.5)
WBC: 8 10*3/uL (ref 4.0–10.5)
nRBC: 0 % (ref 0.0–0.2)

## 2020-01-03 LAB — BASIC METABOLIC PANEL
Anion gap: 10 (ref 5–15)
BUN: 17 mg/dL (ref 8–23)
CO2: 25 mmol/L (ref 22–32)
Calcium: 8.8 mg/dL — ABNORMAL LOW (ref 8.9–10.3)
Chloride: 107 mmol/L (ref 98–111)
Creatinine, Ser: 0.67 mg/dL (ref 0.44–1.00)
GFR, Estimated: >60 mL/min (ref >60)
Glucose, Bld: 95 mg/dL (ref 70–99)
Potassium: 3.7 mmol/L (ref 3.5–5.1)
Sodium: 142 mmol/L (ref 135–145)

## 2020-01-03 LAB — TROPONIN I (HIGH SENSITIVITY): Troponin I (High Sensitivity): <2 ng/L (ref <18)

## 2020-01-03 MED ORDER — IOHEXOL 350 MG/ML SOLN
100.0000 mL | Freq: Once | INTRAVENOUS | Status: AC | PRN
  Administered 2020-01-03: 100 mL via INTRAVENOUS

## 2020-01-03 MED ORDER — PREDNISONE 10 MG PO TABS: 40.0000 mg | ORAL_TABLET | Freq: Every day | ORAL | 0 refills | Status: AC

## 2020-01-03 NOTE — Discharge Instructions (Signed)
Please return for any problem.  Follow-up with your regular care provider as instructed. °

## 2020-01-03 NOTE — ED Triage Notes (Signed)
Pt arrived via walk in, c/o SOB and chest pain. States she had COVID in august and has been having issues since. States she was seen at urgent care x1 month ago, dx with URI, given medications with no relief, seen by PCP last week, dx with pneumonia, given abx with no relief.

## 2020-01-03 NOTE — ED Notes (Signed)
Pt ambulated to bathroom 

## 2020-01-03 NOTE — ED Provider Notes (Signed)
[facility]-EMERGENCY DEPT Provider Note   CSN: [id number] Arrival date & time: 01/03/20  1205     History Chief Complaint  Patient presents with  . Shortness of Breath  . Chest Pain    Julie Kirk is a 74 y.o. female.  74 year old female with prior medical history as detailed below presents for evaluation of cough, shortness of breath, and chest congestion.  Patient reports Covid infection in August of this past year.  This did not require hospitalization.  Subsequently, patient has had 3 episodes of cough and chest congestion.  Each episode has been treated with a combination of antibiotics, albuterol MDI, and steroids.  Patient is currently on Levaquin.  She is on day 7 of a 10-day course.  She is also currently taking steroids.  She is also using an albuterol MDI with minimal improvement in her symptoms.  Patient is presenting today complaining of continued episodes of cough and congestion.  She denies fever.  She denies chest pain or dyspnea.  She reports good p.o. intake. She denies prior history of lung disease prior to COVID infection.  The history is provided by the patient and medical records.  Cough Cough characteristics:  Dry Sputum characteristics:  Nondescript Severity:  Mild Onset quality:  Gradual Duration:  1 week Timing:  Constant Progression:  Waxing and waning Chronicity:  Recurrent Smoker: no   Worsened by:  Nothing Ineffective treatments:  Beta-agonist inhaler and cough suppressants Associated symptoms: no chest pain, no fever and no shortness of breath        Past Medical History:  Diagnosis Date  . Thyroid disease     There are no problems to display for this patient.   Past Surgical History:  Procedure Laterality Date  . ABDOMINAL HYSTERECTOMY    . ABDOMINAL SURGERY    . APPENDECTOMY    . BACK SURGERY    . KNEE ARTHROSCOPY       OB History   No obstetric history on file.     No family history on file.  Social  History   Tobacco Use  . Smoking status: Never Smoker  Substance Use Topics  . Alcohol use: No  . Drug use: No    Home Medications Prior to Admission medications   Medication Sig Start Date End Date Taking? Authorizing Provider  cloNIDine (CATAPRES) 0.1 MG tablet Take 1 tablet (0.1 mg total) by mouth every 12 (twelve) hours as needed (withdrawal). 04/11/16   Eber Hong, MD  fentaNYL (DURAGESIC - DOSED MCG/HR) 50 MCG/HR Place 50 mcg onto the skin every 3 (three) days.    [provider]  levothyroxine (SYNTHROID, LEVOTHROID) 75 MCG tablet Take 75 mcg by mouth daily before breakfast.    [provider]  lisinopril-hydrochlorothiazide (PRINZIDE,ZESTORETIC) 10-12.5 MG tablet Take 1 tablet by mouth daily.    [provider]    Allergies    Sulfa antibiotics  Review of Systems   Review of Systems  Constitutional: Negative for fever.  Respiratory: Positive for cough. Negative for shortness of breath.   Cardiovascular: Negative for chest pain.  All other systems reviewed and are negative.   Physical Exam Updated Vital Signs BP (!) 161/74   Pulse (!) 56   Temp 98.1 F (36.7 C) (Oral)   Resp 18   SpO2 100%   Physical Exam Vitals and nursing note reviewed.  Constitutional:      General: She is not in acute distress.    Appearance: She is well-developed.  HENT:     Head: Normocephalic and atraumatic.  Eyes:     Conjunctiva/sclera: Conjunctivae normal.     Pupils: Pupils are equal, round, and reactive to light.  Cardiovascular:     Rate and Rhythm: Normal rate and regular rhythm.     Heart sounds: Normal heart sounds.  Pulmonary:     Effort: Pulmonary effort is normal. No respiratory distress.     Breath sounds: Normal breath sounds.  Abdominal:     General: There is no distension.     Palpations: Abdomen is soft.     Tenderness: There is no abdominal tenderness.  Musculoskeletal:        General: No deformity. Normal range of motion.      Cervical back: Normal range of motion and neck supple.  Skin:    General: Skin is warm and dry.  Neurological:     General: No focal deficit present.     Mental Status: She is alert and oriented to person, place, and time.     ED Results / Procedures / Treatments   Labs (all labs ordered are listed, but only abnormal results are displayed) Labs Reviewed  BASIC METABOLIC PANEL - Abnormal; Notable for the following components:      Result Value   Calcium 8.8 (*)    All other components within normal limits  CBC  TROPONIN I (HIGH SENSITIVITY)  TROPONIN I (HIGH SENSITIVITY)    EKG EKG Interpretation  Date/Time:  Monday January 03 2020 12:17:10 EST Ventricular Rate:  58 PR Interval:    QRS Duration: 134 QT Interval:  454 QTC Calculation: 446 R Axis:   32 Text Interpretation: Sinus rhythm Left bundle branch block Baseline wander in lead(s) V1 12 Lead; Mason-Likar Confirmed by Kristine Royal [phone]) on 01/03/2020 2:54:36 PM   Radiology DG Chest 2 View  Result Date: 01/03/2020 CLINICAL DATA:  74 year old female with chest pain and shortness of breath. Status post COVID-19 in August. EXAM: CHEST - 2 VIEW COMPARISON:  None. FINDINGS: Semi upright AP and lateral views. Cardiac size at the upper limits of normal. Other mediastinal contours are within normal limits. Visualized tracheal air column is within normal limits. Lung volumes are also at the upper limits of normal. No pneumothorax, pulmonary edema, pleural effusion or confluent pulmonary opacity. No acute osseous abnormality identified. Negative visible bowel gas pattern. Right upper quadrant cholecystectomy clips. IMPRESSION: No acute cardiopulmonary abnormality. Electronically Signed   By: Odessa Fleming M.D.   On: 01/03/2020 12:57   CT Angio Chest PE W and/or Wo Contrast  Result Date: 01/03/2020 CLINICAL DATA:  74 year old female with shortness of. Concern for pulmonary EXAM: CT ANGIOGRAPHY CHEST WITH CONTRAST TECHNIQUE:  Multidetector CT imaging of the chest was performed using the standard protocol during bolus administration of intravenous contrast. Multiplanar CT image reconstructions and MIPs were obtained to evaluate the vascular anatomy. CONTRAST:  OMNIPAQUE IOHEXOL 350 MG/ML SOLN COMPARISON:  Chest radiograph dated 01/03/2020. FINDINGS: Cardiovascular: There is mild cardiomegaly. No pericardial effusion. The thoracic aorta is unremarkable. There is mild dilatation of the main pulmonary trunk suggestive of a degree of pulmonary hypertension. Clinical correlation is recommended. No CT evidence of pulmonary embolism. Mediastinum/Nodes: There is no hilar or mediastinal adenopathy. The esophagus is grossly unremarkable. No mediastinal fluid collection. Lungs/Pleura: The lungs are clear. There is no pleural effusion pneumothorax. The central airways are patent. Upper Abdomen: Cholecystectomy. Musculoskeletal: Degenerative changes of the spine. No acute osseous pathology. Review of the MIP images confirms the above  findings. IMPRESSION: 1. No acute intrathoracic pathology. No CT evidence of pulmonary embolism. 2. Mild cardiomegaly. 3. Mildly dilated main pulmonary trunk suggestive of a degree of pulmonary hypertension. Clinical correlation is recommended. Electronically Signed   By: Elgie Collard M.D.   On: 01/03/2020 18:47    Procedures Procedures (including critical care time)  Medications Ordered in ED Medications  iohexol (OMNIPAQUE) 350 MG/ML injection 100 mL (has no administration in time range)    ED Course  I have reviewed the triage vital signs and the nursing notes.  Pertinent labs & imaging results that were available during my care of the patient were reviewed by me and considered in my medical decision making (see chart for details).    MDM Rules/Calculators/A&P                          MDM  Screen complete  Dontavia Brand was evaluated in Emergency Department on 01/03/2020 for the  symptoms described in the history of present illness. She was evaluated in the context of the global COVID-19 pandemic, which necessitated consideration that the patient might be at risk for infection with the SARS-CoV-2 virus that causes COVID-19. Institutional protocols and algorithms that pertain to the evaluation of patients at risk for COVID-19 are in a state of rapid change based on information released by regulatory bodies including the CDC and federal and state organizations. These policies and algorithms were followed during the patient's care in the ED.  Patient is presenting for evaluation of episodic symptoms of cough and bronchospasm.  Patient's describe symptoms have been occurring since recent Covid infection 2 months prior.  Patient is currently on day 7 of 10 for Levaquin prescription.  She is also currently taking prednisone 20 mg total daily.   Work-up today does not demonstrate evidence of infection or pneumonia.  There is no evidence on CT of PE.  Suspect that patient will feel improved with increased steroid burst over the next several days.  Patient is strongly advised to follow-up closely with your regular care provider.  At time of discharge patient is adamant about needing to leave secondary to childcare issues.  Patient does understand need for close follow-up.  Patient does feel improved following her ED evaluation.  Strict return precautions given and understood.  Final Clinical Impression(s) / ED Diagnoses Final diagnoses:  Cough    Rx / DC Orders ED Discharge Orders         Ordered    predniSONE (DELTASONE) 10 MG tablet  Daily        01/03/20 1905           Wynetta Fines, MD 01/03/20 1912

## 2020-05-18 DIAGNOSIS — Z789 Other specified health status: Secondary | ICD-10-CM | POA: Diagnosis not present

## 2020-05-18 DIAGNOSIS — R059 Cough, unspecified: Secondary | ICD-10-CM | POA: Diagnosis not present

## 2020-05-18 DIAGNOSIS — Z299 Encounter for prophylactic measures, unspecified: Secondary | ICD-10-CM | POA: Diagnosis not present

## 2020-05-18 DIAGNOSIS — J069 Acute upper respiratory infection, unspecified: Secondary | ICD-10-CM | POA: Diagnosis not present

## 2020-05-21 DIAGNOSIS — R918 Other nonspecific abnormal finding of lung field: Secondary | ICD-10-CM | POA: Diagnosis not present

## 2020-05-21 DIAGNOSIS — R079 Chest pain, unspecified: Secondary | ICD-10-CM | POA: Diagnosis not present

## 2020-05-21 DIAGNOSIS — Z791 Long term (current) use of non-steroidal anti-inflammatories (NSAID): Secondary | ICD-10-CM | POA: Diagnosis not present

## 2020-05-21 DIAGNOSIS — I10 Essential (primary) hypertension: Secondary | ICD-10-CM | POA: Diagnosis not present

## 2020-05-21 DIAGNOSIS — R059 Cough, unspecified: Secondary | ICD-10-CM | POA: Diagnosis not present

## 2020-05-21 DIAGNOSIS — I517 Cardiomegaly: Secondary | ICD-10-CM | POA: Diagnosis not present

## 2020-05-21 DIAGNOSIS — Z20822 Contact with and (suspected) exposure to covid-19: Secondary | ICD-10-CM | POA: Diagnosis not present

## 2020-05-21 DIAGNOSIS — R0902 Hypoxemia: Secondary | ICD-10-CM | POA: Diagnosis not present

## 2020-05-21 DIAGNOSIS — R110 Nausea: Secondary | ICD-10-CM | POA: Diagnosis not present

## 2020-05-21 DIAGNOSIS — J189 Pneumonia, unspecified organism: Secondary | ICD-10-CM | POA: Diagnosis not present

## 2020-05-21 DIAGNOSIS — M1990 Unspecified osteoarthritis, unspecified site: Secondary | ICD-10-CM | POA: Diagnosis not present

## 2020-05-21 DIAGNOSIS — J209 Acute bronchitis, unspecified: Secondary | ICD-10-CM | POA: Diagnosis not present

## 2020-05-21 DIAGNOSIS — R519 Headache, unspecified: Secondary | ICD-10-CM | POA: Diagnosis not present

## 2020-05-21 DIAGNOSIS — R197 Diarrhea, unspecified: Secondary | ICD-10-CM | POA: Diagnosis not present

## 2020-05-21 DIAGNOSIS — J40 Bronchitis, not specified as acute or chronic: Secondary | ICD-10-CM | POA: Diagnosis not present

## 2020-05-21 DIAGNOSIS — R531 Weakness: Secondary | ICD-10-CM | POA: Diagnosis not present

## 2020-05-21 DIAGNOSIS — E079 Disorder of thyroid, unspecified: Secondary | ICD-10-CM | POA: Diagnosis not present

## 2020-05-21 DIAGNOSIS — J410 Simple chronic bronchitis: Secondary | ICD-10-CM | POA: Diagnosis not present

## 2020-05-29 DIAGNOSIS — Z09 Encounter for follow-up examination after completed treatment for conditions other than malignant neoplasm: Secondary | ICD-10-CM | POA: Diagnosis not present

## 2020-05-29 DIAGNOSIS — D692 Other nonthrombocytopenic purpura: Secondary | ICD-10-CM | POA: Diagnosis not present

## 2020-05-29 DIAGNOSIS — J189 Pneumonia, unspecified organism: Secondary | ICD-10-CM | POA: Diagnosis not present

## 2020-05-29 DIAGNOSIS — E039 Hypothyroidism, unspecified: Secondary | ICD-10-CM | POA: Diagnosis not present

## 2020-05-29 DIAGNOSIS — I10 Essential (primary) hypertension: Secondary | ICD-10-CM | POA: Diagnosis not present

## 2020-07-05 ENCOUNTER — Other Ambulatory Visit: Payer: Self-pay

## 2020-07-05 ENCOUNTER — Ambulatory Visit: Payer: Medicare HMO | Admitting: Pulmonary Disease

## 2020-07-05 ENCOUNTER — Encounter: Payer: Self-pay | Admitting: Pulmonary Disease

## 2020-07-05 VITALS — BP 132/84 | HR 64 | Temp 97.8°F | Ht 65.0 in | Wt 180.8 lb

## 2020-07-05 DIAGNOSIS — U099 Post covid-19 condition, unspecified: Secondary | ICD-10-CM | POA: Diagnosis not present

## 2020-07-05 DIAGNOSIS — I270 Primary pulmonary hypertension: Secondary | ICD-10-CM | POA: Diagnosis not present

## 2020-07-05 NOTE — Progress Notes (Signed)
Julie Kirk    [id number]    01-24-1946  Primary Care Physician:Shah, Beatrix Fetters, MD  Referring Physician: Kirstie Peri, MD [address] [place],  [place] [address]  Chief complaint:   Consult for post COVID-46  HPI: 75 year old with history of hypertension, hypothyroidism Developed COVID-19 in August 2021.  She did not require hospitalization Post COVID she developed 2 episodes of pneumonia.  Initially evaluated in the emergency room in November 2021 and then she was hospitalized at [facility] in April 2022 CT showed bilateral groundglass opacities, enlarged PA  She has received several rounds of antibiotics and steroids Overall she is doing better and feels back to normal  Pets: Outdoor cats Occupation: Retired Psychologist, educational Exposures: No mold, hot tub, Financial controller.  No feather pillows or comforters Smoking history: Never smoker Travel history: Lives in [place].  No significant travel history Relevant family history: No family history of lung disease   Outpatient Encounter Medications as of 07/05/2020  Medication Sig  . ibuprofen (ADVIL) 800 MG tablet Take 800 mg by mouth every 8 (eight) hours as needed.  Marland Kitchen levothyroxine (SYNTHROID) 88 MCG tablet Take 88 mcg by mouth daily.  Marland Kitchen levothyroxine (SYNTHROID, LEVOTHROID) 75 MCG tablet Take 75 mcg by mouth daily before breakfast.  . meloxicam (MOBIC) 15 MG tablet meloxicam 15 mg tablet  TAKE 1 TABLET BY MOUTH EVERY DAY  . predniSONE (DELTASONE) 10 MG tablet Take 4 tablets (40 mg total) by mouth daily.  . cloNIDine (CATAPRES) 0.1 MG tablet Take 1 tablet (0.1 mg total) by mouth every 12 (twelve) hours as needed (withdrawal). (Patient not taking: No sig reported)  . fentaNYL (DURAGESIC - DOSED MCG/HR) 50 MCG/HR Place 50 mcg onto the skin every 3 (three) days. (Patient not taking: No sig reported)  . lisinopril-hydrochlorothiazide (PRINZIDE,ZESTORETIC) 10-12.5 MG tablet Take 1 tablet by mouth daily. (Patient not taking: Reported on  07/05/2020)   No facility-administered encounter medications on file as of 07/05/2020.    Allergies as of 07/05/2020 - Review Complete 07/05/2020  Allergen Reaction Noted  . Sulfa antibiotics Nausea And Vomiting 04/11/2016  . Other Nausea And Vomiting 05/21/2020    Past Medical History:  Diagnosis Date  . Thyroid disease     Past Surgical History:  Procedure Laterality Date  . ABDOMINAL HYSTERECTOMY    . ABDOMINAL SURGERY    . APPENDECTOMY    . BACK SURGERY    . KNEE ARTHROSCOPY      No family history on file.  Social History   Socioeconomic History  . Marital status: Widowed    Spouse name: Not on file  . Number of children: Not on file  . Years of education: Not on file  . Highest education level: Not on file  Occupational History  . Not on file  Tobacco Use  . Smoking status: Never Smoker  . Smokeless tobacco: Never Used  Substance and Sexual Activity  . Alcohol use: No  . Drug use: No  . Sexual activity: Not on file  Other Topics Concern  . Not on file  Social History Narrative  . Not on file   Social Determinants of Health   Financial Resource Strain: Not on file  Food Insecurity: Not on file  Transportation Needs: Not on file  Physical Activity: Not on file  Stress: Not on file  Social Connections: Not on file  Intimate Partner Violence: Not on file    Review of systems: Review of Systems  Constitutional: Negative  for fever and chills.  HENT: Negative.   Eyes: Negative for blurred vision.  Respiratory: as per HPI  Cardiovascular: Negative for chest pain and palpitations.  Gastrointestinal: Negative for vomiting, diarrhea, blood per rectum. Genitourinary: Negative for dysuria, urgency, frequency and hematuria.  Musculoskeletal: Negative for myalgias, back pain and joint pain.  Skin: Negative for itching and rash.  Neurological: Negative for dizziness, tremors, focal weakness, seizures and loss of consciousness.  Endo/Heme/Allergies: Negative  for environmental allergies.  Psychiatric/Behavioral: Negative for depression, suicidal ideas and hallucinations.  All other systems reviewed and are negative.  Physical Exam: Blood pressure 132/84, pulse 64, temperature 97.8 F (36.6 C), temperature source Temporal, height 5\' 5"  (1.651 m), weight 180 lb 12.8 oz (82 kg), SpO2 98 %. Gen:      No acute distress HEENT:  EOMI, sclera anicteric Neck:     No masses; no thyromegaly Lungs:    Clear to auscultation bilaterally; normal respiratory effort CV:         Regular rate and rhythm; no murmurs Abd:      + bowel sounds; soft, non-tender; no palpable masses, no distension Ext:    No edema; adequate peripheral perfusion Skin:      Warm and dry; no rash Neuro: alert and oriented x 3 Psych: normal mood and affect  Data Reviewed: Imaging: CTA 01/03/2020-no pulmonary embolism, clear lungs.  Mild cardiomegaly, mildly dilated pulmonary trunk.   CTA 05/21/2020- patchy opacities throughout right lung, cardiomegaly, dilatation of pulmonary artery I have reviewed the images personally  PFTs:  Labs:  Assessment:  Post COVID-19 She has had couple of episodes of pneumonia after her COVID infection.  Overall she is doing well and back to baseline  We will get high-res CT and PFTs for better evaluation of the lung  Cardiomegaly, enlarged PA Findings noted on prior CT scans Order echocardiogram  Plan/Recommendations: High-res CT, PFTs Echocardiogram  07/21/2020 MD [facility] 07/05/2020, 9:44 AM  CC: 07/07/2020, MD

## 2020-07-05 NOTE — Patient Instructions (Signed)
I have reviewed all imaging and hospital records  I am glad you are feeling better after your COVID and pneumonia Will get high-res CT and PFTs for follow-up evaluation Schedule echocardiogram for evaluation of pulmonary hypertension Return to clinic after PFTs

## 2020-07-13 ENCOUNTER — Ambulatory Visit (HOSPITAL_COMMUNITY): Payer: Medicare HMO

## 2020-07-24 ENCOUNTER — Telehealth (HOSPITAL_COMMUNITY): Payer: Self-pay | Admitting: Pulmonary Disease

## 2020-07-24 NOTE — Telephone Encounter (Signed)
I called patient to schedule Echocardiogram and she declined to schedule at this time.  Order will be removed from ECHO WQ. Thank you

## 2020-08-17 ENCOUNTER — Ambulatory Visit: Payer: Medicare HMO | Admitting: Pulmonary Disease

## 2021-04-10 ENCOUNTER — Telehealth: Payer: Self-pay | Admitting: Pulmonary Disease

## 2021-04-10 NOTE — Telephone Encounter (Signed)
I am ok with the switch.  Looks like she cancelled the studies we ordered and was a no show for the follow up visit.

## 2021-04-10 NOTE — Telephone Encounter (Signed)
Spoke to patient, who is requesting to switch from Dr. Vaughan Browner to Dr. Erin Fulling.   Dr. Vaughan Browner and Dr. Erin Fulling, please advise. Thanks

## 2021-04-10 NOTE — Telephone Encounter (Signed)
Noted.  Will await Dr. Cindi Carbon response.

## 2021-04-11 NOTE — Telephone Encounter (Signed)
Spoke to patient and scheduled OV 04/30/2021 at 2:30. Nothing further needed.

## 2021-04-30 ENCOUNTER — Ambulatory Visit: Payer: Medicare HMO | Admitting: Pulmonary Disease

## 2021-08-17 IMAGING — CT CT ANGIO CHEST
2 of 6 series · 18 of 36 positions shown · IV contrast (omnipaque)
Comparison: Chest radiograph dated 01/03/2020.

CLINICAL DATA: 74-year-old female with shortness of. Concern for
pulmonary

EXAM:
CT ANGIOGRAPHY CHEST WITH CONTRAST
TECHNIQUE: Multidetector CT imaging of the chest was performed using the
standard protocol during bolus administration of intravenous
contrast. Multiplanar CT image reconstructions and MIPs were
obtained to evaluate the vascular anatomy.
CONTRAST:  100mL OMNIPAQUE IOHEXOL 350 MG/ML SOLN

[Series 5: thins · axial · 0.62mm/px · z∈[+1333,+1599]mm · 17 of 300 slices shown]
[im 17/300  lung]
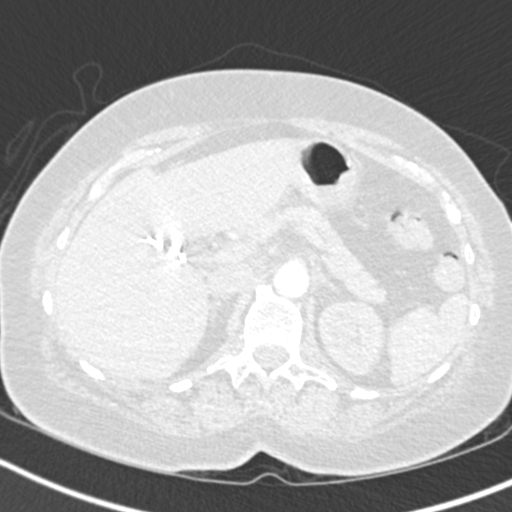
[im 34/300  mediastinal]
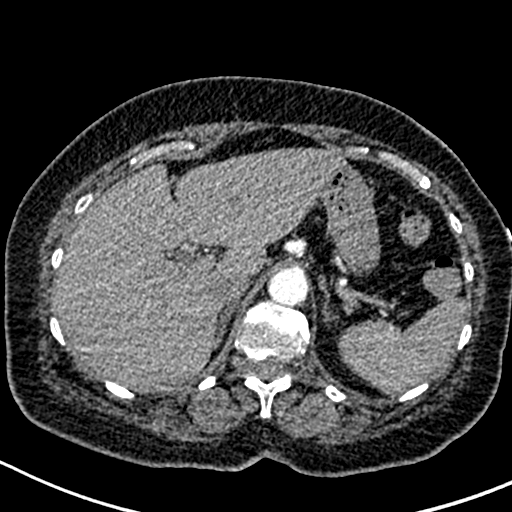
[im 50/300  lung]
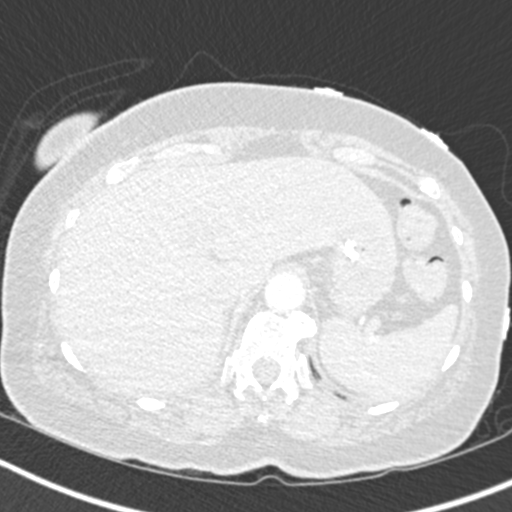
[im 67/300  mediastinal]
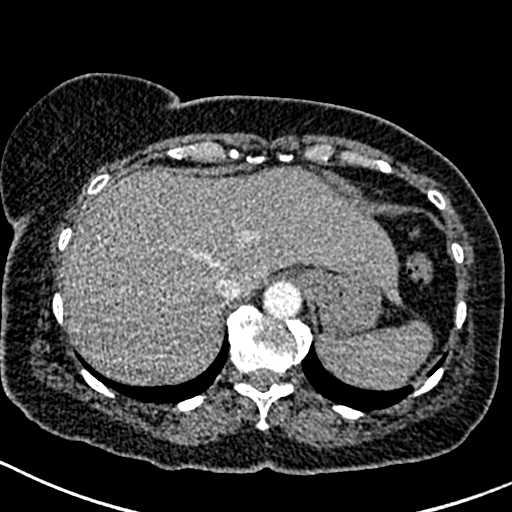
[im 84/300  lung]
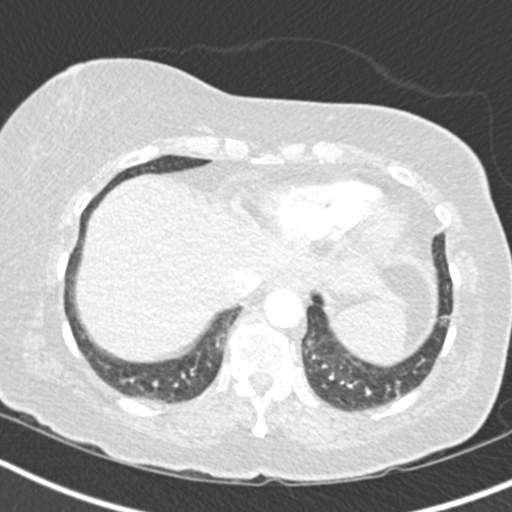
[im 100/300  mediastinal]
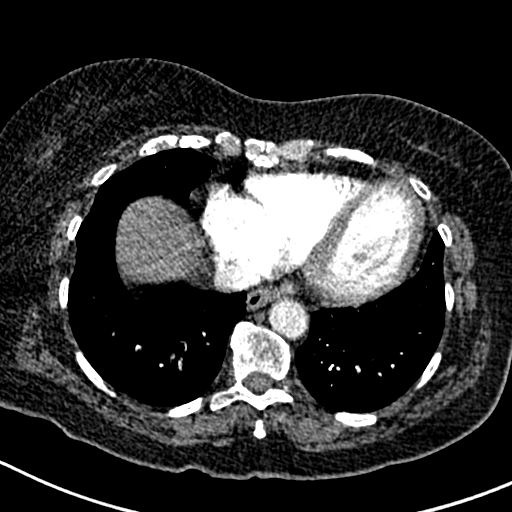
[im 117/300  lung]
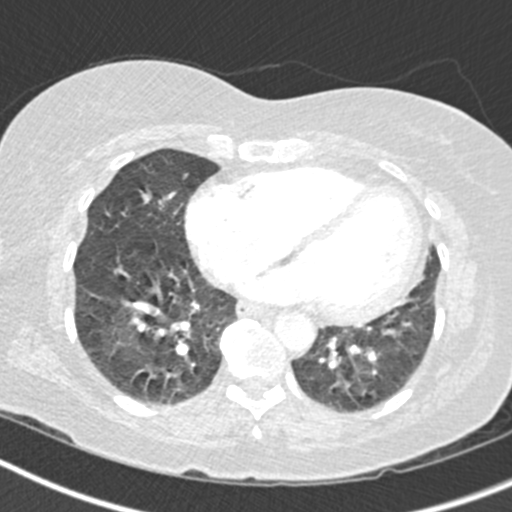
[im 133/300  mediastinal]
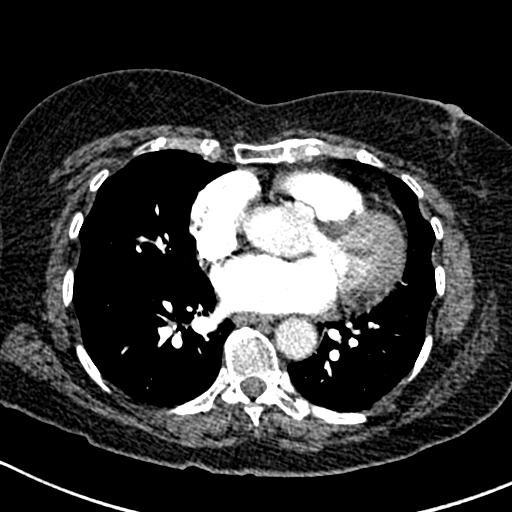
[im 150/300  lung]
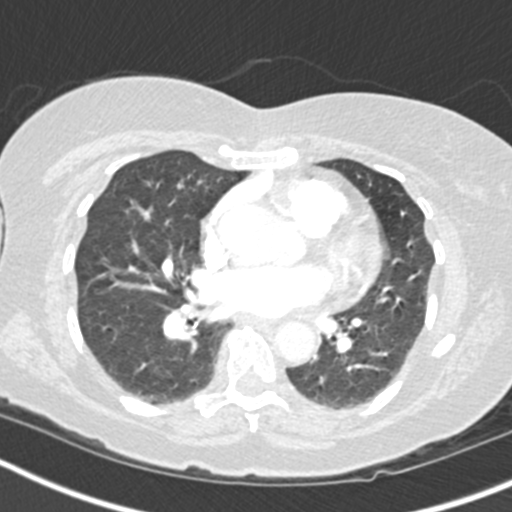
[im 167/300  mediastinal]
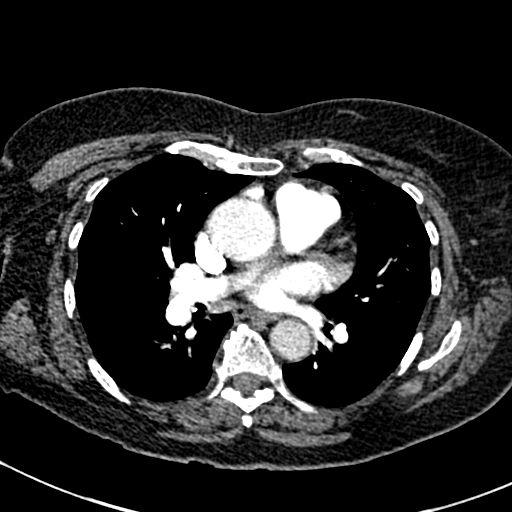
[im 183/300  lung]
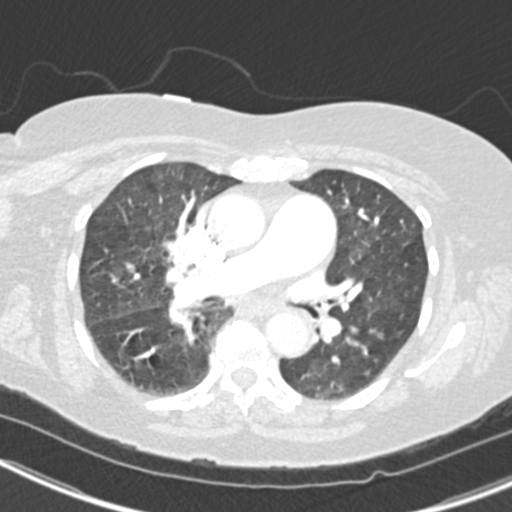
[im 200/300  mediastinal]
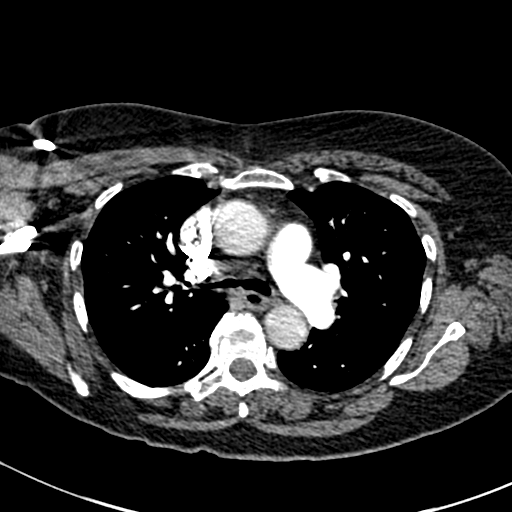
[im 216/300  lung]
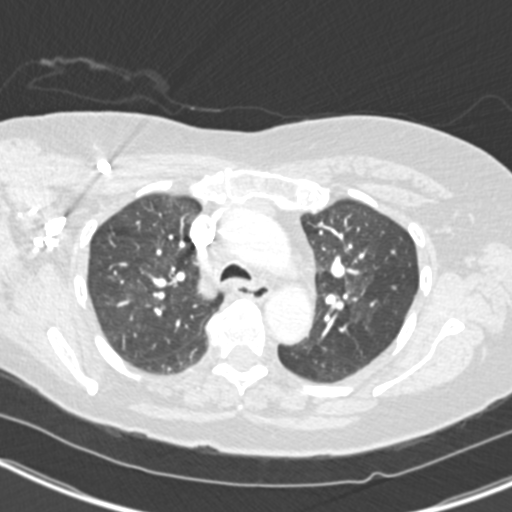
[im 233/300  mediastinal]
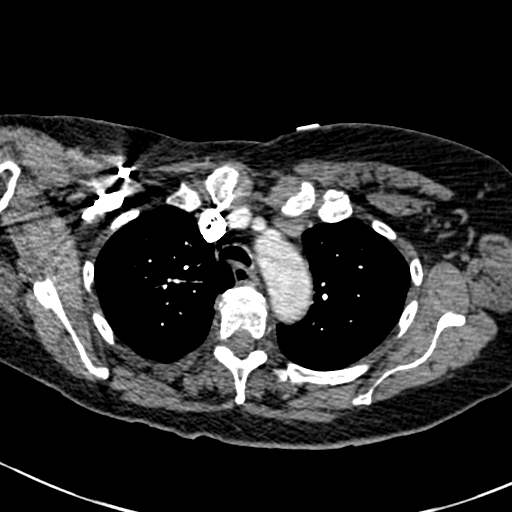
[im 250/300  lung]
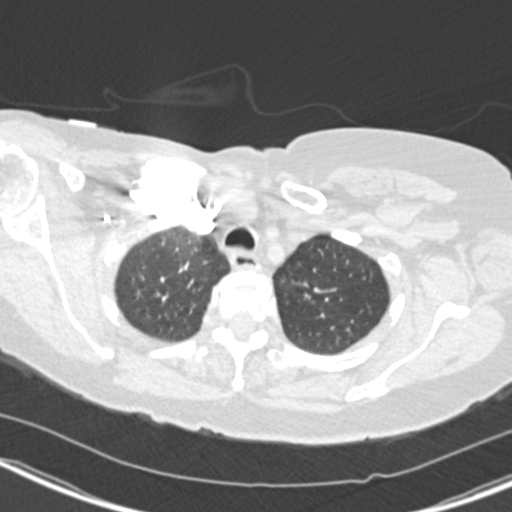
[im 266/300  mediastinal]
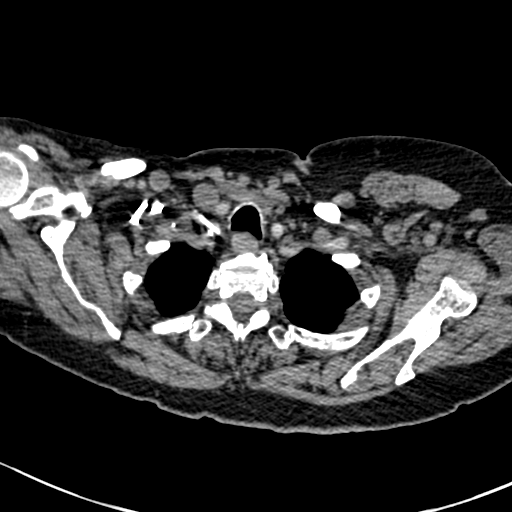
[im 283/300  lung]
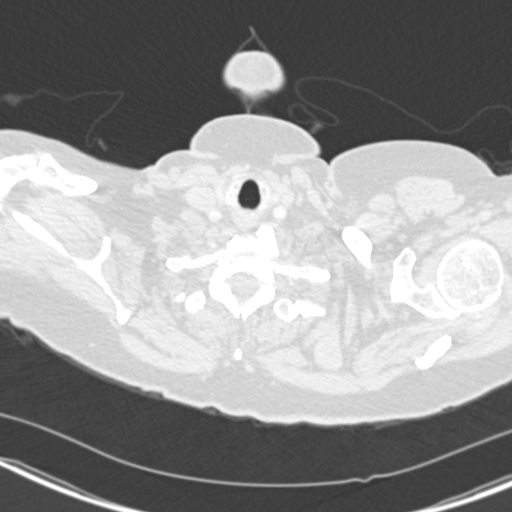

[Series 7: coronal mpr · coronal · 0.58mm/px · 1 of 131 slices shown]
[im 66/131  mediastinal]
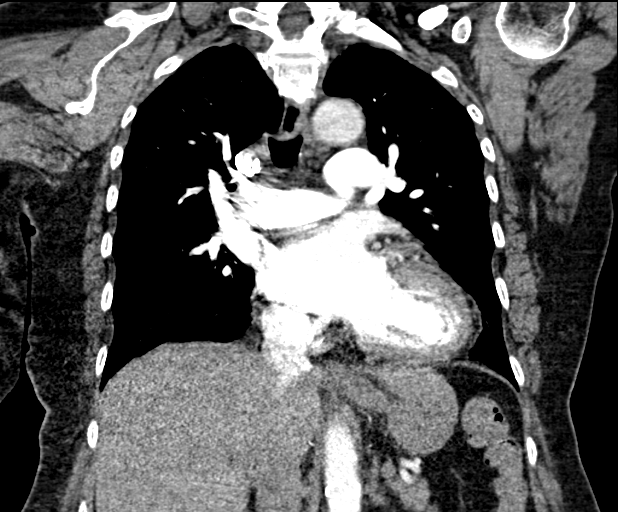

[18 of 36 positions shown; findings below may reference images not displayed]

FINDINGS: Cardiovascular: There is mild cardiomegaly. No pericardial effusion.
The thoracic aorta is unremarkable. There is mild dilatation of the
main pulmonary trunk suggestive of a degree of pulmonary
hypertension. Clinical correlation is recommended. No CT evidence of
pulmonary embolism.

Mediastinum/Nodes: There is no hilar or mediastinal adenopathy. The
esophagus is grossly unremarkable. No mediastinal fluid collection.

Lungs/Pleura: The lungs are clear. There is no pleural effusion
pneumothorax. The central airways are patent.

Upper Abdomen: Cholecystectomy.

Musculoskeletal: Degenerative changes of the spine. No acute osseous
pathology.

Review of the MIP images confirms the above findings.
IMPRESSION: 1. No acute intrathoracic pathology. No CT evidence of pulmonary
embolism.
2. Mild cardiomegaly.
3. Mildly dilated main pulmonary trunk suggestive of a degree of
pulmonary hypertension. Clinical correlation is recommended.

## 2021-08-17 IMAGING — CR DG CHEST 2V
2 series · 2 of 2 positions shown · non-contrast
Comparison: None.

CLINICAL DATA: 74-year-old female with chest pain and shortness of
breath. Status post 7SRRV-20 in [REDACTED].

EXAM:
CHEST - 2 VIEW

[w chest pa]
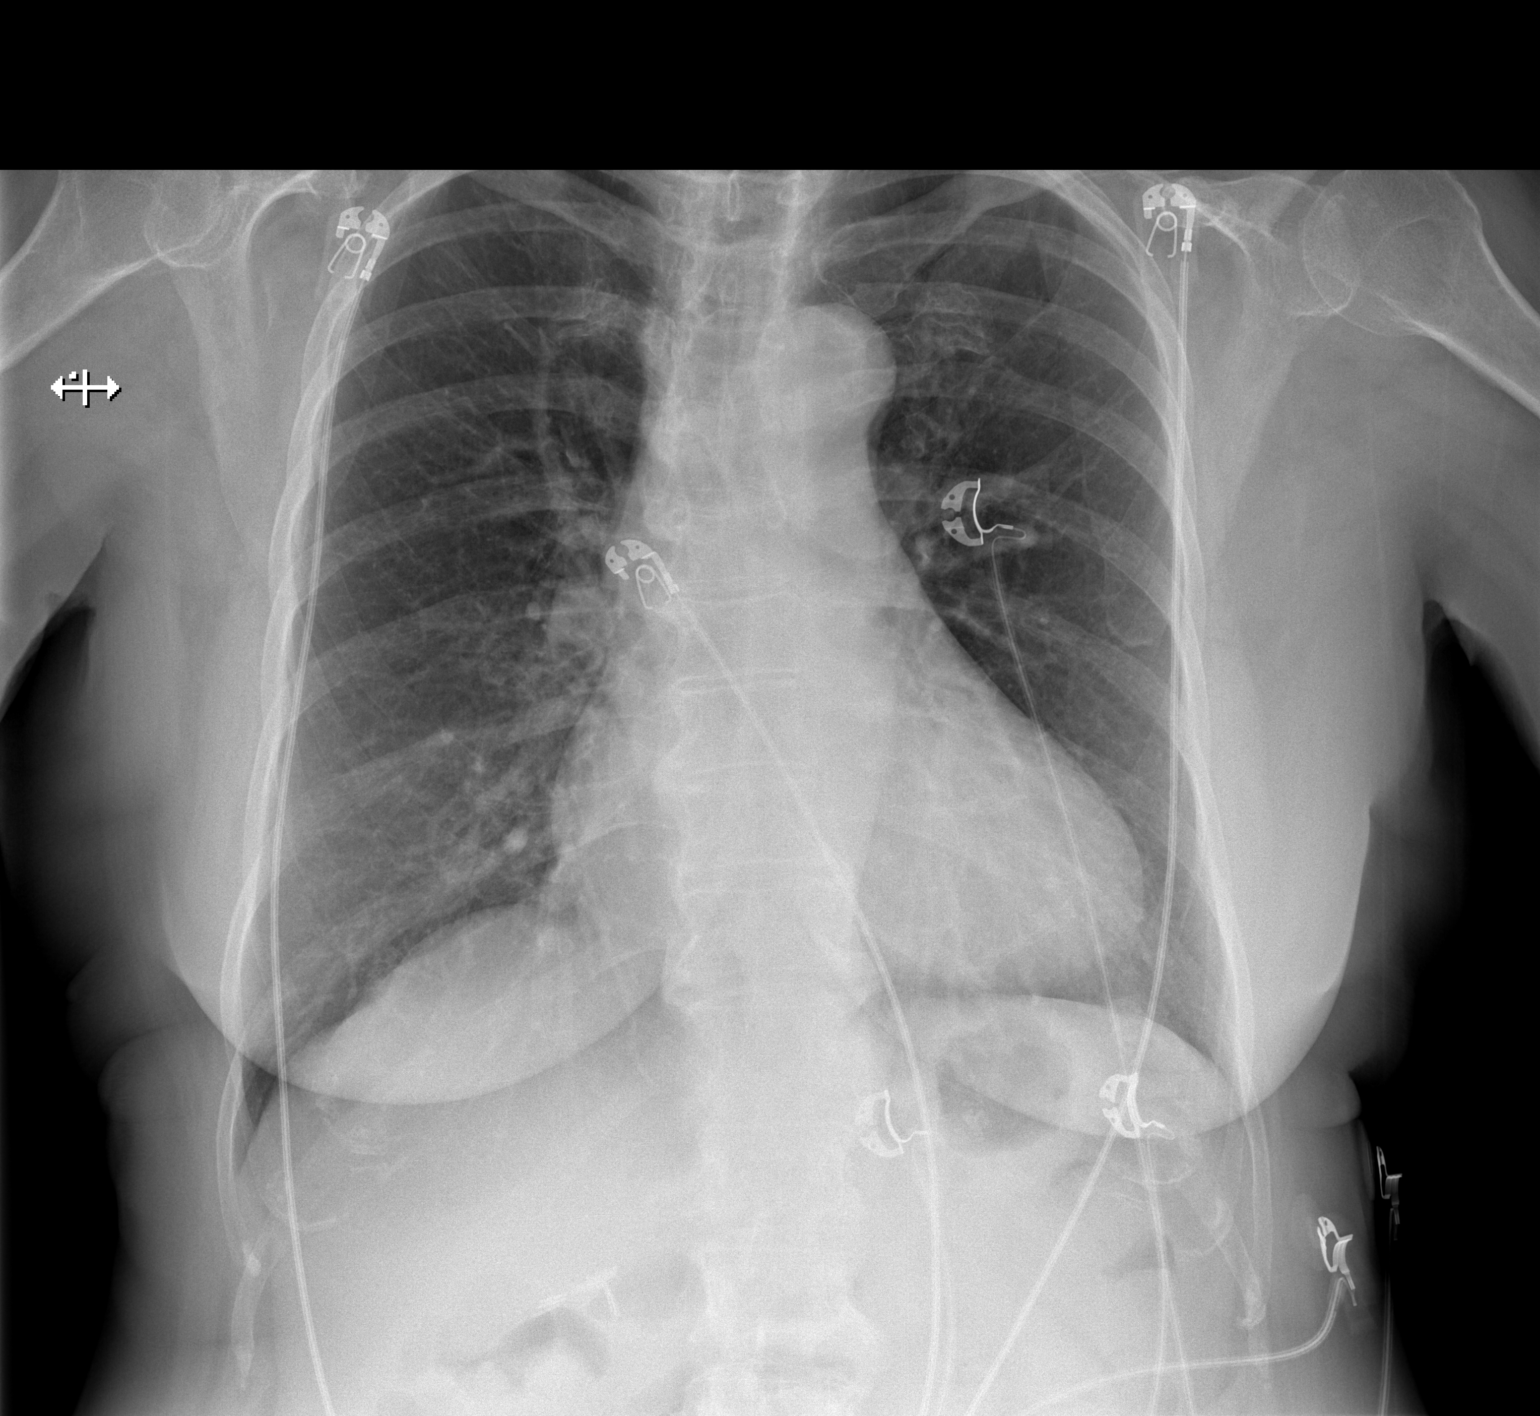

[w chest lat]
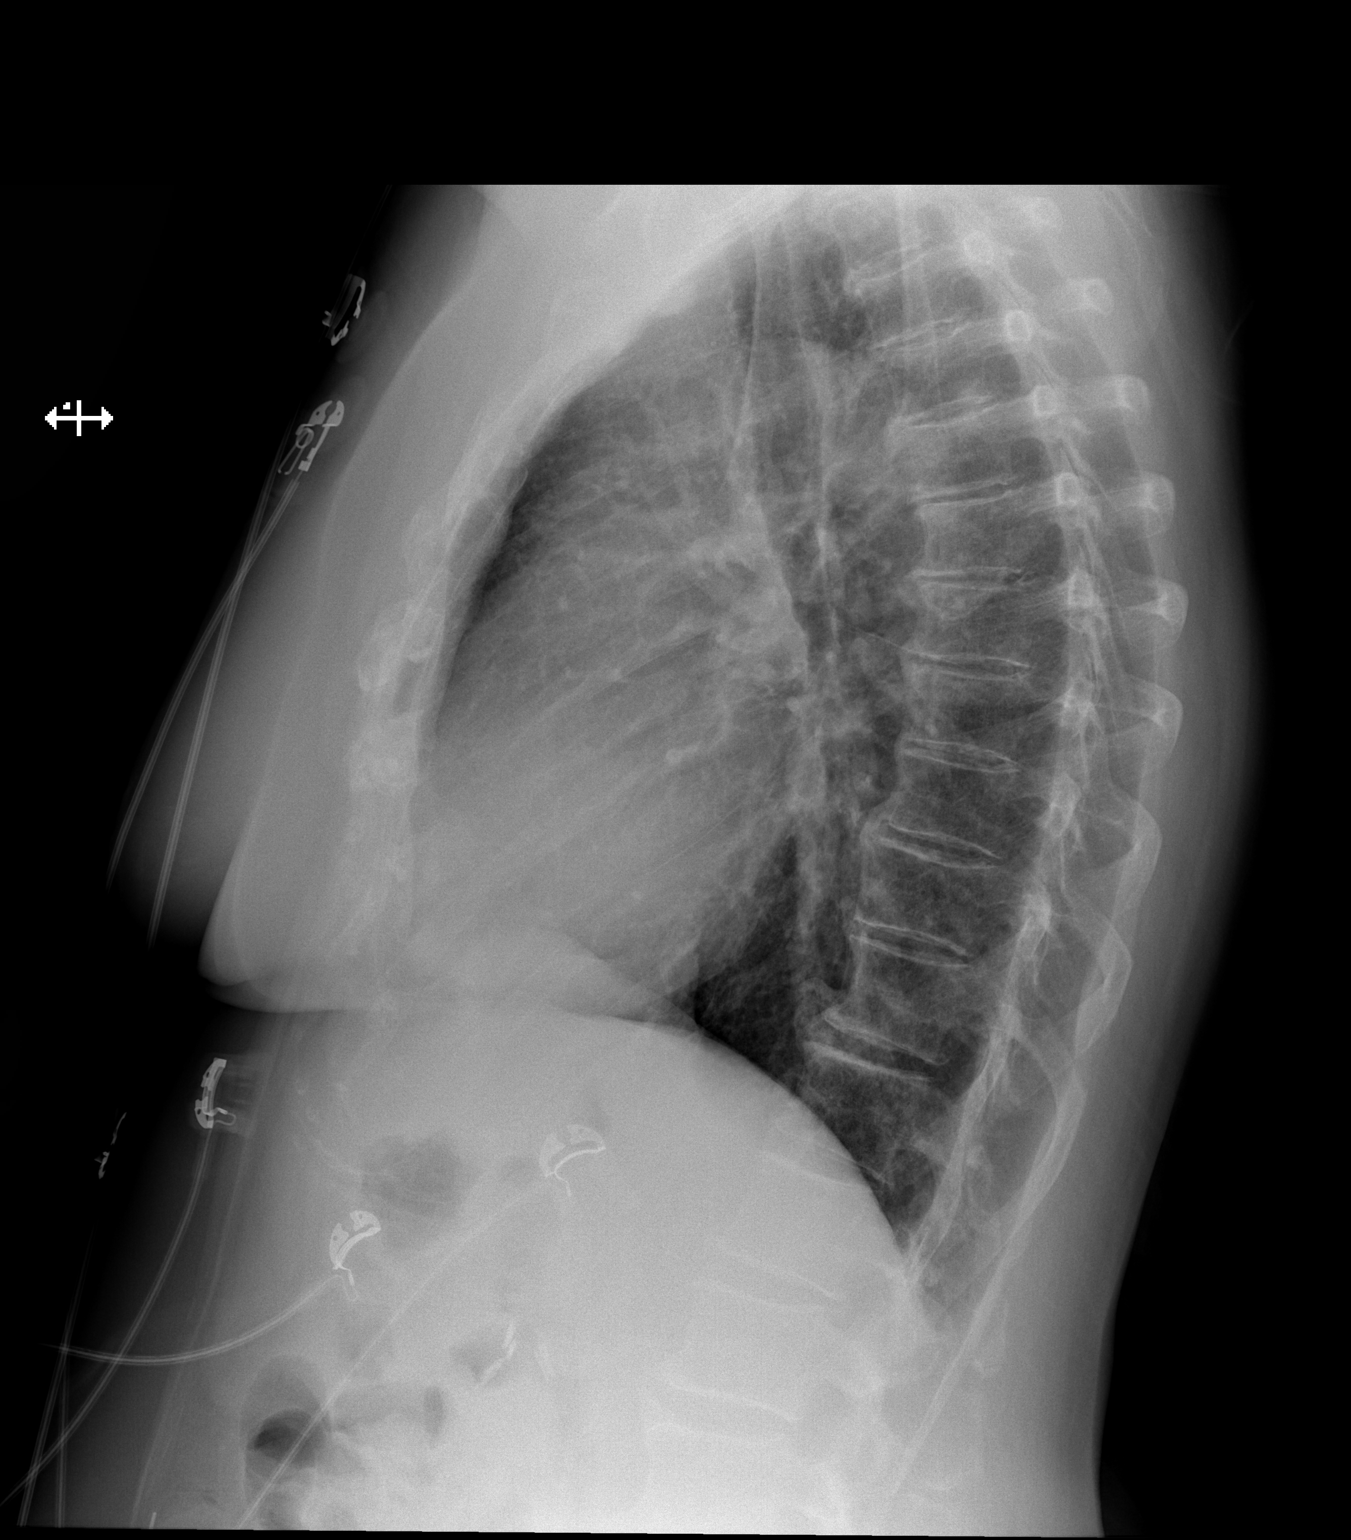

[2 of 2 positions shown; findings below may reference images not displayed]

FINDINGS: Semi upright AP and lateral views. Cardiac size at the upper limits
of normal. Other mediastinal contours are within normal limits.
Visualized tracheal air column is within normal limits. Lung volumes
are also at the upper limits of normal. No pneumothorax, pulmonary
edema, pleural effusion or confluent pulmonary opacity.

No acute osseous abnormality identified.

Negative visible bowel gas pattern. Right upper quadrant
cholecystectomy clips.
IMPRESSION: No acute cardiopulmonary abnormality.
